# Patient Record
Sex: Female | Born: 1960 | Race: White | Hispanic: No | Smoking: Current every day smoker
Health system: Southern US, Community
[De-identification: ages and names within clinical notes are randomized; demographics above are authoritative.]

## PROBLEM LIST (undated history)

## (undated) DIAGNOSIS — Z9861 Coronary angioplasty status: Secondary | ICD-10-CM

## (undated) DIAGNOSIS — I739 Peripheral vascular disease, unspecified: Secondary | ICD-10-CM

## (undated) DIAGNOSIS — I10 Essential (primary) hypertension: Secondary | ICD-10-CM

## (undated) DIAGNOSIS — H409 Unspecified glaucoma: Secondary | ICD-10-CM

## (undated) DIAGNOSIS — I2510 Atherosclerotic heart disease of native coronary artery without angina pectoris: Secondary | ICD-10-CM

## (undated) DIAGNOSIS — Z8719 Personal history of other diseases of the digestive system: Secondary | ICD-10-CM

## (undated) DIAGNOSIS — F17200 Nicotine dependence, unspecified, uncomplicated: Secondary | ICD-10-CM

## (undated) DIAGNOSIS — K219 Gastro-esophageal reflux disease without esophagitis: Secondary | ICD-10-CM

## (undated) DIAGNOSIS — K449 Diaphragmatic hernia without obstruction or gangrene: Secondary | ICD-10-CM

## (undated) HISTORY — PX: CERVICAL FUSION: SHX112

---

## 2010-11-02 DIAGNOSIS — I2510 Atherosclerotic heart disease of native coronary artery without angina pectoris: Secondary | ICD-10-CM

## 2010-11-02 HISTORY — PX: CORONARY ANGIOPLASTY WITH STENT PLACEMENT: SHX49

## 2010-11-02 HISTORY — DX: Atherosclerotic heart disease of native coronary artery without angina pectoris: I25.10

## 2012-11-02 DIAGNOSIS — I739 Peripheral vascular disease, unspecified: Secondary | ICD-10-CM

## 2012-11-02 HISTORY — DX: Peripheral vascular disease, unspecified: I73.9

## 2012-11-02 HISTORY — PX: FEMORAL BYPASS: SHX50

## 2015-12-04 ENCOUNTER — Encounter (HOSPITAL_COMMUNITY): Payer: Self-pay | Admitting: *Deleted

## 2015-12-04 ENCOUNTER — Other Ambulatory Visit: Payer: Self-pay | Admitting: Cardiology

## 2015-12-04 ENCOUNTER — Emergency Department (HOSPITAL_COMMUNITY)
Admission: EM | Admit: 2015-12-04 | Discharge: 2015-12-04 | Disposition: A | Discharge status: Home / Self Care | Payer: BLUE CROSS/BLUE SHIELD | Attending: Emergency Medicine | Admitting: Emergency Medicine

## 2015-12-04 ENCOUNTER — Emergency Department (HOSPITAL_COMMUNITY): Payer: BLUE CROSS/BLUE SHIELD

## 2015-12-04 ENCOUNTER — Other Ambulatory Visit: Payer: Self-pay

## 2015-12-04 DIAGNOSIS — R1030 Lower abdominal pain, unspecified: Secondary | ICD-10-CM | POA: Diagnosis present

## 2015-12-04 DIAGNOSIS — I10 Essential (primary) hypertension: Secondary | ICD-10-CM | POA: Diagnosis present

## 2015-12-04 DIAGNOSIS — R0602 Shortness of breath: Secondary | ICD-10-CM | POA: Diagnosis not present

## 2015-12-04 DIAGNOSIS — Z9861 Coronary angioplasty status: Principal | ICD-10-CM

## 2015-12-04 DIAGNOSIS — I739 Peripheral vascular disease, unspecified: Secondary | ICD-10-CM | POA: Insufficient documentation

## 2015-12-04 DIAGNOSIS — Z01810 Encounter for preprocedural cardiovascular examination: Secondary | ICD-10-CM

## 2015-12-04 DIAGNOSIS — Z7982 Long term (current) use of aspirin: Secondary | ICD-10-CM | POA: Insufficient documentation

## 2015-12-04 DIAGNOSIS — F17200 Nicotine dependence, unspecified, uncomplicated: Secondary | ICD-10-CM | POA: Diagnosis present

## 2015-12-04 DIAGNOSIS — Z8719 Personal history of other diseases of the digestive system: Secondary | ICD-10-CM | POA: Insufficient documentation

## 2015-12-04 DIAGNOSIS — Z8249 Family history of ischemic heart disease and other diseases of the circulatory system: Secondary | ICD-10-CM

## 2015-12-04 DIAGNOSIS — M79604 Pain in right leg: Secondary | ICD-10-CM | POA: Diagnosis not present

## 2015-12-04 DIAGNOSIS — I2510 Atherosclerotic heart disease of native coronary artery without angina pectoris: Secondary | ICD-10-CM

## 2015-12-04 DIAGNOSIS — F17210 Nicotine dependence, cigarettes, uncomplicated: Secondary | ICD-10-CM | POA: Insufficient documentation

## 2015-12-04 DIAGNOSIS — K219 Gastro-esophageal reflux disease without esophagitis: Secondary | ICD-10-CM | POA: Diagnosis not present

## 2015-12-04 DIAGNOSIS — M79605 Pain in left leg: Secondary | ICD-10-CM | POA: Diagnosis not present

## 2015-12-04 HISTORY — DX: Unspecified glaucoma: H40.9

## 2015-12-04 HISTORY — DX: Coronary angioplasty status: Z98.61

## 2015-12-04 HISTORY — DX: Personal history of other diseases of the digestive system: Z87.19

## 2015-12-04 HISTORY — DX: Nicotine dependence, unspecified, uncomplicated: F17.200

## 2015-12-04 HISTORY — DX: Peripheral vascular disease, unspecified: I73.9

## 2015-12-04 HISTORY — DX: Diaphragmatic hernia without obstruction or gangrene: K44.9

## 2015-12-04 HISTORY — DX: Essential (primary) hypertension: I10

## 2015-12-04 HISTORY — DX: Gastro-esophageal reflux disease without esophagitis: K21.9

## 2015-12-04 HISTORY — DX: Atherosclerotic heart disease of native coronary artery without angina pectoris: I25.10

## 2015-12-04 LAB — COMPREHENSIVE METABOLIC PANEL
ALBUMIN: 4.3 g/dL (ref 3.5–5.0)
ALT: 19 U/L (ref 14–54)
ANION GAP: 11 (ref 5–15)
AST: 18 U/L (ref 15–41)
Alkaline Phosphatase: 84 U/L (ref 38–126)
BUN: 12 mg/dL (ref 6–20)
CHLORIDE: 102 mmol/L (ref 101–111)
CO2: 24 mmol/L (ref 22–32)
Calcium: 10 mg/dL (ref 8.9–10.3)
Creatinine, Ser: 0.94 mg/dL (ref 0.44–1.00)
GFR calc Af Amer: >60 mL/min (ref >60)
Glucose, Bld: 115 mg/dL — ABNORMAL HIGH (ref 65–99)
POTASSIUM: 4.3 mmol/L (ref 3.5–5.1)
Sodium: 137 mmol/L (ref 135–145)
Total Bilirubin: 0.5 mg/dL (ref 0.3–1.2)
Total Protein: 7.1 g/dL (ref 6.5–8.1)

## 2015-12-04 LAB — CBC WITH DIFFERENTIAL/PLATELET
BASOS ABS: 0 10*3/uL (ref 0.0–0.1)
BASOS PCT: 0 %
EOS PCT: 1 %
Eosinophils Absolute: 0.1 10*3/uL (ref 0.0–0.7)
HCT: 46 % (ref 36.0–46.0)
Hemoglobin: 15.7 g/dL — ABNORMAL HIGH (ref 12.0–15.0)
Lymphocytes Relative: 14 %
Lymphs Abs: 1.1 10*3/uL (ref 0.7–4.0)
MCH: 31.8 pg (ref 26.0–34.0)
MCHC: 34.1 g/dL (ref 30.0–36.0)
MCV: 93.3 fL (ref 78.0–100.0)
MONO ABS: 0.6 10*3/uL (ref 0.1–1.0)
Monocytes Relative: 7 %
Neutro Abs: 5.9 10*3/uL (ref 1.7–7.7)
Neutrophils Relative %: 78 %
PLATELETS: 226 10*3/uL (ref 150–400)
RBC: 4.93 MIL/uL (ref 3.87–5.11)
RDW: 13.7 % (ref 11.5–15.5)
WBC: 7.5 10*3/uL (ref 4.0–10.5)

## 2015-12-04 LAB — TROPONIN I

## 2015-12-04 MED ORDER — LISINOPRIL 10 MG PO TABS: 10.0000 mg | ORAL_TABLET | Freq: Every day | ORAL | Status: AC

## 2015-12-04 MED ORDER — PRAVASTATIN SODIUM 40 MG PO TABS: 40.0000 mg | ORAL_TABLET | Freq: Every day | ORAL | Status: DC

## 2015-12-04 MED ORDER — IOHEXOL 350 MG/ML SOLN
100.0000 mL | Freq: Once | INTRAVENOUS | Status: AC | PRN
  Administered 2015-12-04: 100 mL via INTRAVENOUS

## 2015-12-04 MED ORDER — ASPIRIN 81 MG PO CHEW: 81.0000 mg | CHEWABLE_TABLET | Freq: Every day | ORAL | Status: DC

## 2015-12-04 NOTE — Discharge Instructions (Signed)
Peripheral Vascular Disease Peripheral vascular disease (PVD) is a disease of the blood vessels that are not part of your heart and brain. A simple term for PVD is poor circulation. In most cases, PVD narrows the blood vessels that carry blood from your heart to the rest of your body. This can result in a decreased supply of blood to your arms, legs, and internal organs, like your stomach or kidneys. However, it most often affects a person's lower legs and feet. There are two types of PVD.  Organic PVD. This is the more common type. It is caused by damage to the structure of blood vessels.  Functional PVD. This is caused by conditions that make blood vessels contract and tighten (spasm). Without treatment, PVD tends to get worse over time. PVD can also lead to acute ischemic limb. This is when an arm or limb suddenly has trouble getting enough blood. This is a medical emergency. CAUSES Each type of PVD has many different causes. The most common cause of PVD is buildup of a fatty material (plaque) inside of your arteries (atherosclerosis). Small amounts of plaque can break off from the walls of the blood vessels and become lodged in a smaller artery. This blocks blood flow and can cause acute ischemic limb. Other common causes of PVD include:  Blood clots that form inside of blood vessels.  Injuries to blood vessels.  Diseases that cause inflammation of blood vessels or cause blood vessel spasms.  Health behaviors and health history that increase your risk of developing PVD. RISK FACTORS  You may have a greater risk of PVD if you:  Have a family history of PVD.  Have certain medical conditions, including:  High cholesterol.  Diabetes.  High blood pressure (hypertension).  Coronary heart disease.  Past problems with blood clots.  Past injury, such as burns or a broken bone. These may have damaged blood vessels in your limbs.  Buerger disease. This is caused by inflamed blood  vessels in your hands and feet.  Some forms of arthritis.  Rare birth defects that affect the arteries in your legs.  Use tobacco.  Do not get enough exercise.  Are obese.  Are age 50 or older. SIGNS AND SYMPTOMS  PVD may cause many different symptoms. Your symptoms depend on what part of your body is not getting enough blood. Some common signs and symptoms include:  Cramps in your lower legs. This may be a symptom of poor leg circulation (claudication).  Pain and weakness in your legs while you are physically active that goes away when you rest (intermittent claudication).  Leg pain when at rest.  Leg numbness, tingling, or weakness.  Coldness in a leg or foot, especially when compared with the other leg.  Skin or hair changes. These can include:  Hair loss.  Shiny skin.  Pale or bluish skin.  Thick toenails.  Inability to get or maintain an erection (erectile dysfunction). People with PVD are more prone to developing ulcers and sores on their toes, feet, or legs. These may take longer than normal to heal. DIAGNOSIS Your health care provider may diagnose PVD from your signs and symptoms. The health care provider will also do a physical exam. You may have tests to find out what is causing your PVD and determine its severity. Tests may include:  Blood pressure recordings from your arms and legs and measurements of the strength of your pulses (pulse volume recordings).  Imaging studies using sound waves to take pictures of   the blood flow through your blood vessels (Doppler ultrasound).  Injecting a dye into your blood vessels before having imaging studies using:  X-rays (angiogram or arteriogram).  Computer-generated X-rays (CT angiogram).  A powerful electromagnetic field and a computer (magnetic resonance angiogram or MRA). TREATMENT Treatment for PVD depends on the cause of your condition and the severity of your symptoms. It also depends on your age. Underlying  causes need to be treated and controlled. These include long-lasting (chronic) conditions, such as diabetes, high cholesterol, and high blood pressure. You may need to first try making lifestyle changes and taking medicines. Surgery may be needed if these do not work. Lifestyle changes may include:  Quitting smoking.  Exercising regularly.  Following a low-fat, low-cholesterol diet. Medicines may include:  Blood thinners to prevent blood clots.  Medicines to improve blood flow.  Medicines to improve your blood cholesterol levels. Surgical procedures may include:  A procedure that uses an inflated balloon to open a blocked artery and improve blood flow (angioplasty).  A procedure to put in a tube (stent) to keep a blocked artery open (stent implant).  Surgery to reroute blood flow around a blocked artery (peripheral bypass surgery).  Surgery to remove dead tissue from an infected wound on the affected limb.  Amputation. This is surgical removal of the affected limb. This may be necessary in cases of acute ischemic limb that are not improved through medical or surgical treatments. HOME CARE INSTRUCTIONS  Take medicines only as directed by your health care provider.  Do not use any tobacco products, including cigarettes, chewing tobacco, or electronic cigarettes. If you need help quitting, ask your health care provider.  Lose weight if you are overweight, and maintain a healthy weight as directed by your health care provider.  Eat a diet that is low in fat and cholesterol. If you need help, ask your health care provider.  Exercise regularly. Ask your health care provider to suggest some good activities for you.  Use compression stockings or other mechanical devices as directed by your health care provider.  Take good care of your feet.  Wear comfortable shoes that fit well.  Check your feet often for any cuts or sores. SEEK MEDICAL CARE IF:  You have cramps in your legs  while walking.  You have leg pain when you are at rest.  You have coldness in a leg or foot.  Your skin changes.  You have erectile dysfunction.  You have cuts or sores on your feet that are not healing. SEEK IMMEDIATE MEDICAL CARE IF:  Your arm or leg turns cold and blue.  Your arms or legs become red, warm, swollen, painful, or numb.  You have chest pain or trouble breathing.  You suddenly have weakness in your face, arm, or leg.  You become very confused or lose the ability to speak.  You suddenly have a very bad headache or lose your vision.   This information is not intended to replace advice given to you by your health care provider. Make sure you discuss any questions you have with your health care provider.   Document Released: 11/26/2004 Document Revised: 11/09/2014 Document Reviewed: 03/29/2014 Elsevier Interactive Patient Education 2016 [employer]  

## 2015-12-04 NOTE — ED Notes (Signed)
Pt has a history of hypertension. Pt states she has not been able to take her BP meds in 2 years.

## 2015-12-04 NOTE — Consult Note (Addendum)
Vascular Surgery Consultation  Reason for Consult: Pain in both legs-right worse than left with ambulation  HPI: Sara Donovan is a 55 y.o. female who presents for evaluation of pain in both legs with ambulation right worse than left. Patient has history of stent in the left iliac artery most likely and a bypass from the right iliac to the above-knee popliteal artery both performed in [place] 2 years ago. The vascular surgeon who was from [facility] retired and the patient has not been seen in follow-up in the past year or so. She has been having left leg symptoms for the past few months which were not totally disabling. A few days ago she began having severe claudication in the right leg beginning in the buttock extending to the foot after ambulating about 30-40 feet. She does not have rest pain. She has no history of nonhealing ulcers infection or gangrene. She has a heavy history of tobacco abuse 40+ years at 1-2 packs per day. She also has a cardiac history having undergone PTCA with 2 or 3 stents placed about 3 years ago and then all [place]. She has not had a myocardial infarction she states. She denies any active chest pain, dyspnea on exertion, PND, orthopnea, or hemoptysis. She presented to the emergency department because of her worsening symptoms.     Past Medical History  Diagnosis Date  . Hypertension   . PVD (peripheral vascular disease) (HCC)   . Glaucoma   . Hiatal hernia   . GERD (gastroesophageal reflux disease)    Past Surgical History  Procedure Laterality Date  . Femoral bypass    . Cervical fusion     Social History   Social History  . Marital Status: Legally Separated    Spouse Name: N/A  . Number of Children: N/A  . Years of Education: N/A   Social History Main Topics  . Smoking status: Current Every Day Smoker -- 1.00 packs/day    Types: Cigarettes  . Smokeless tobacco: None  . Alcohol Use: No  . Drug Use: Yes    Special: Marijuana   . Sexual Activity: Not Asked   Other Topics Concern  . None   Social History Narrative  . None   No family history on file. Not on File Prior to Admission medications   Medication Sig Start Date End Date Taking? Authorizing Provider  acetaminophen (TYLENOL) 325 MG tablet Take 650 mg by mouth every 6 (six) hours as needed for mild pain.   Yes Historical Provider, MD  Homeopathic Products (LEG CRAMP RELIEF PO) Take 1 tablet by mouth every 8 (eight) hours as needed (leg cramps).   Yes Historical Provider, MD     Positive ROS: See history of present illness. No history of lateralizing weakness, aphasia, amaurosis fugax, diplopia, blurred vision, syncope. Denies active cardiac symptoms at this time. No diabetes mellitus.  All other systems have been reviewed and were otherwise negative with the exception of those mentioned in the HPI and as above.  Physical Exam: Filed Vitals:   12/04/15 0900 12/04/15 0930  BP: 160/79 177/75  Pulse: 54 62  Temp:    Resp: 13 17    General: Alert, no acute distress HEENT: Normal for age Cardiovascular: Regular rate and rhythm. Carotid pulses 2+, no bruits audible Respiratory: Clear to auscultation. No cyanosis, no use of accessory musculature GI: No organomegaly, abdomen is soft and non-tender Skin: No lesions in the area of chief complaint Neurologic: Sensation intact distally Psychiatric: Patient  is competent for consent with normal mood and affect Musculoskeletal: No obvious deformities Extremities: Absent femoral pulses to palpation bilaterally and no distal pulses palpable. Both feet have intact motion and sensation. Audible left posterior tibial and dorsalis pedis though not palpable Audible right AT at  ankle which is sluggish. No calf tenderness or decreased motion or sensation bilaterally.      Assessment/Plan:  #1 severe PAD with occlusion of right iliopopliteal [place] graft placed 2 years ago and antral [place] #2 probable  failing stent in left iliac system performed at about the same time 2 years ago #3 coronary artery disease-status post PTCA with 2 or 3 stents placed in [place] about 3 years ago #4 severe ongoing tobacco abuse-40+ years at 1-2 packs per day  #1 we'll obtain CT angiogram with runoff. Suspect patient has severe aortoiliac occlusive disease and occlusion of right superficial femoral artery and will likely need aortobifemoral bypass grafting #2 cardiology consult for cardiac clearance for above surgery. Have contacted cardiology today and they will see patient in emergency department  #3 strongly emphasized to patient the importance of tobacco cessation because of her multiple vascular problems Will review CT angiogram later today and make formal recommendations   Josephina Gip, MD 12/04/2015 10:42 AM

## 2015-12-04 NOTE — Progress Notes (Signed)
Patient ID: Sara Donovan, female   DOB: [birth year], 55 y.o.   MRN: [id number] Patient's CT angiogram of abdomen and pelvis and lower extremities reviewed Severe diffuse bilateral iliac occlusive disease with total occlusion right iliac system and near total occlusion left iliac system. Left SFA is patent right SFA is occluded with reconstitution of below-knee popliteal artery. Profunda and right inguinal area is small but does visualize.  Patient needs aortobifemoral bypass grafting with possible right femoral popliteal bypass grafting. Await cardiac clearance Will schedule her tentatively for Wednesday, January 15 unless symptoms worsen in the interim She is having no rest pain at the present time but for symptoms worsen she will be in touch with [place] Okay to DC home after cardiology sees patient today  I've discussed this with patient and the risks involved and she would like to proceed on Wednesday the 15th as indicated

## 2015-12-04 NOTE — ED Provider Notes (Signed)
CSN: [id number]     Arrival date & time 12/04/15  0725 History   First MD Initiated Contact with Patient 12/04/15 [phone]     Chief Complaint  Patient presents with  . Leg Pain  . Groin Pain     Patient is a 55 y.o. female presenting with leg pain and groin pain. The history is provided by the patient.  Leg Pain Associated symptoms: no fatigue, no fever and no neck pain   Groin Pain Associated symptoms include shortness of breath. Pertinent negatives include no chest pain and no abdominal pain.   patient resents with pain in her right groin and going down the leg. She's had a previous vascular bypass up at [facility]. She also had stents in the other leg. States that she has had increasing pain with walking. Is in the whole leg. Some shortness of breath with the exertion tube with no chest pain. No pain at rest. States her right foot is cooler now. No trauma.   she has been off her medications for around 2 years. She states she was on meds for blood pressure high cholesterol but then could not afford them. She states she also had been on Plavix but had bleeding somewhere and she got 12 units of blood. States were not able to find area may stop the medicine. States she tried to get into a vascular surgeon here but was not able to without referral. No fevers or chills. She will get occasional headaches. Denies substance abuse. She continues to smoke. She states they tried to do the bypass at one area on her leg pointing to proximal thigh but states had to move higher for it. There is a scar on her abdomen. There is also a scar near the Past Medical History  Diagnosis Date  . Hypertension   . PVD (peripheral vascular disease) (HCC) 2014    Rt BPG, Lt PTA  . Glaucoma   . Hiatal hernia   . GERD (gastroesophageal reflux disease)   . H/O: GI bleed     while on Plavix, required transfusion  . CAD S/P percutaneous coronary angioplasty 2012    "3 stents" at [facility]  . Smoker    Past Surgical  History  Procedure Laterality Date  . Femoral bypass  2014  . Cervical fusion    . Coronary angioplasty with stent placement  2012    [facility]   No family history on file. Social History  Substance Use Topics  . Smoking status: Current Every Day Smoker -- 1.00 packs/day    Types: Cigarettes  . Smokeless tobacco: None  . Alcohol Use: No   OB History    No data available     Review of Systems  Constitutional: Negative for fever, appetite change and fatigue.  Respiratory: Positive for shortness of breath.   Cardiovascular: Negative for chest pain.  Gastrointestinal: Negative for abdominal pain.  Musculoskeletal: Positive for gait problem. Negative for joint swelling and neck pain.  Skin: Negative for color change.  Neurological: Negative for numbness.  Hematological: Negative for adenopathy.      Allergies  Review of patient's allergies indicates no known allergies.  Home Medications   Prior to Admission medications   Medication Sig Start Date End Date Taking? Authorizing Provider  acetaminophen (TYLENOL) 325 MG tablet Take 650 mg by mouth every 6 (six) hours as needed for mild pain.   Yes Historical Provider, MD  aspirin 81 MG chewable tablet Chew 1 tablet (81 mg total) by  mouth daily. 12/04/15   Benjiman Core, MD  Homeopathic Products (LEG CRAMP RELIEF PO) Take 1 tablet by mouth every 8 (eight) hours as needed (leg cramps).   Yes Historical Provider, MD  lisinopril (PRINIVIL,ZESTRIL) 10 MG tablet Take 1 tablet (10 mg total) by mouth daily. 12/04/15   Benjiman Core, MD  pravastatin (PRAVACHOL) 40 MG tablet Take 1 tablet (40 mg total) by mouth daily. 12/04/15   Benjiman Core, MD   BP 189/80 mmHg  Pulse 56  Temp(Src) 98.3 F (36.8 C) (Oral)  Resp 13  SpO2 97% Physical Exam  Constitutional: She appears well-developed.  HENT:  Head: Atraumatic.  Eyes: EOM are normal.  Cardiovascular: Normal rate.   Pulmonary/Chest: She is in respiratory distress.  Abdominal: Soft.  There is no tenderness.  Genitourinary:  Tenderness to right groin medially palpable femoral pulse.  Musculoskeletal:  Right foot cooler than contralateral. Mildly delays capillary refill. Dopplerable dorsalis pedis pulse but not palpable. Unable to palpate a popliteal pulse. No tenderness to the calf or foot. No wounds to the foot.  Skin: Skin is warm.    ED Course  Procedures (including critical care time) Labs Review Labs Reviewed  CBC WITH DIFFERENTIAL/PLATELET - Abnormal; Notable for the following:    Hemoglobin 15.7 (*)    All other components within normal limits  COMPREHENSIVE METABOLIC PANEL - Abnormal; Notable for the following:    Glucose, Bld 115 (*)    All other components within normal limits  TROPONIN I    Imaging Review Ct Angio Ao+bifem W/cm &/or Wo/cm  12/04/2015  CLINICAL DATA:  Bilateral claudication. EXAM: CT ANGIOGRAPHY OF ABDOMINAL AORTA WITH ILIOFEMORAL RUNOFF TECHNIQUE: Multidetector CT imaging of the abdomen, pelvis and lower extremities was performed using the standard protocol during bolus administration of intravenous contrast. Multiplanar CT image reconstructions and MIPs were obtained to evaluate the vascular anatomy. CONTRAST:  OMNIPAQUE IOHEXOL 350 MG/ML SOLN COMPARISON:  None. FINDINGS: Aorta: Visualized lung bases are unremarkable. No gallstones are noted. The liver, spleen and pancreas are unremarkable. Kidneys and right adrenal gland appear normal. 2.6 cm low density left adrenal mass is noted with average Hounsfield measurement of 7, most consistent with adenoma. The appendix appears normal. There is no evidence of bowel obstruction. 3.5 cm left ovarian cyst is noted. Atherosclerosis of abdominal aorta is noted without aneurysm or dissection. The renal and mesenteric arteries are widely patent without significant stenosis. Right Lower Extremity: There is complete occlusion of the right common and external iliac and common femoral arteries. Occluded  bypass graft is seen extending from right common iliac artery to common femoral artery. Collateral vessels are seen leading to reconstitution of right femoral popliteal bypass graft, which is patent. There is noted moderate diffuse narrowing of the distal portion of the graft secondary to atheromatous disease. Right popliteal artery is patent although small in caliber, with all 3 trifurcation vessels patent and flowing toward ankle mortise. Left Lower Extremity: Severe atheromatous disease is seen involving the left common and external iliac arteries with multifocal severe stenoses present. There is complete occlusion of the left common femoral artery. Collaterals are seen leading to reconstitution of the left superficial femoral artery, which appears to have stents throughout most of its course. Severe focal stenosis is noted in its most distal portion, which is not stented. Left popliteal artery is patent although diminutive in caliber. All 3 trifurcation vessels are patent proximally and seen flowing toward the ankle mortise. Review of the MIP images confirms the above findings.  IMPRESSION: Atherosclerosis of abdominal aorta is noted without aneurysm or dissection. Complete occlusion of the right common and external iliac and common femoral arteries is noted. Popliteal graft extending from right common iliac artery to right common femoral artery is occluded. Collateral vessels are leading to reconstitution of right femoral popliteal bypass graft which is patent. There is noted moderate diffuse narrowing in the distal portion of the graft. Right popliteal artery is widely patent with 3 vessel runoff seen in the right calf. Severe atheromatous disease is noted involving the left common and external iliac arteries with multifocal severe stenoses present. 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 Collaterals lead to reconstitution of the left superficial femoral artery, which appears to have stents  throughout its proximal and middle portions which are widely patent. Severe focal stenosis is noted in the most distal non stented portion of the left superficial from artery. Left popliteal artery is patent although diminutive in caliber, with 3 vessel runoff present in the left calf. 2.6 cm low density left adrenal mass is noted most consistent with adenoma, but followup CT or MRI in 12 months is recommended to ensure stability. 3.5 cm left ovarian cyst is noted. If the patient is postmenopausal, follow-up pelvic ultrasound in 1 year is recommended to ensure stability and rule out neoplasm. Electronically Signed   By: Lupita Raider, M.D.   On: 12/04/2015 13:17   I have personally reviewed and evaluated these images and lab results as part of my medical decision-making.   EKG Interpretation   Date/Time:  Wednesday December 04 2015 08:46:29 EST Ventricular Rate:  58 PR Interval:  139 QRS Duration: 96 QT Interval:  427 QTC Calculation: 419 R Axis:   75 Text Interpretation:  Sinus rhythm Atrial premature complex Biatrial  enlargement Borderline repolarization abnormality Confirmed by Rubin Payor   MD, Shavon Zenz [phone]) on 12/04/2015 8:58:29 AM      MDM   Final diagnoses:  Claudication (HCC)  PVD (peripheral vascular disease) (HCC)      patient with claudication symptoms of right leg. Does have a dopplerable pulse but not palpable in the right foot. Some delayed capillary refill. Has had likely a iliac or aortic popliteal bypass. Blood pressure on the arm was 188/100 and blood pressure on the right ankle was 144/110.   Patient's been seen by Dr. Hart Rochester from vascular surgery and by cardiology. Outpatient follow-up is been arranged and she'll likely need surgery. Discharge home.   Benjiman Core, MD 12/04/15 [phone]

## 2015-12-04 NOTE — ED Notes (Addendum)
Pt in from home c/o R leg numbness onset x 2 days with groin pain, pt states, "It hurts where they went in & went to do my bypass in my right leg. It feels like there is knots in there." pt ambulatory with pain, pt denies slurred speech or dysarthria, no facial droop, pt also reports L leg pain onset x3 mths, pt reports non compliance with HTN & cholesterol meds d/t finances,pt A&O x4, follows commands, speaks in complete sentences

## 2015-12-04 NOTE — Consult Note (Addendum)
Reason for Consult:   Pre op clearance  Requesting Physician: Dr Hart Rochester Primary Cardiologist New (was Dr Earna Coder in [place])  HPI:   55 y/o female from [place] with a history of CAD and PVD. Pt says she had a cath for chest pain in 2012 that showed PVD and CAD. She was sent to [facility] and had "3 stents" placed. In 2014 she was unable to do a treadmill and subsequent workup revealed severe LE PVD. She had Lt LE PTA and RLE BPG. She stopped all her medications > 1 yr ago-"my husband left me, no insurance". She now works at [employer]. She has had increasing RLE claudication. It got to the point where she had pain going from room to room. She could not get an apt with a vascular surgeon so she came to the ED. CTA shows severe bilateral PVD. We are asked to see her for pre op clearance. She denies any chest pain since her coronary stents were placed in [address].   PMHx:  Past Medical History  Diagnosis Date  . Hypertension   . PVD (peripheral vascular disease) (HCC)   . Glaucoma   . Hiatal hernia   . GERD (gastroesophageal reflux disease)     Past Surgical History  Procedure Laterality Date  . Femoral bypass    . Cervical fusion      SOCHx:  reports that she has been smoking Cigarettes.  She has been smoking about 1.00 pack per day. She does not have any smokeless tobacco history on file. She reports that she uses illicit drugs (Marijuana). She reports that she does not drink alcohol.  FAMHx: No family history on file.  ALLERGIES: No Known Allergies  ROS: Review of Systems: General: negative for chills, fever, night sweats or weight changes.  Cardiovascular: negative for chest pain, dyspnea on exertion, edema, orthopnea, palpitations, paroxysmal nocturnal dyspnea or shortness of breath HEENT: negative for any visual disturbances, blindness, glaucoma Dermatological: negative for rash Respiratory: negative for cough, hemoptysis, or wheezing Urologic: negative  for hematuria or dysuria Abdominal: negative for nausea, vomiting, diarrhea, bright red blood per rectum, melena, or hematemesis Neurologic: negative for visual changes, syncope, or dizziness Musculoskeletal: negative for back pain, joint pain, or swelling Psych: cooperative and appropriate All other systems reviewed and are otherwise negative except as noted above.   HOME MEDICATIONS: Prior to Admission medications   Medication Sig Start Date End Date Taking? Authorizing Provider  acetaminophen (TYLENOL) 325 MG tablet Take 650 mg by mouth every 6 (six) hours as needed for mild pain.   Yes Historical Provider, MD  Homeopathic Products (LEG CRAMP RELIEF PO) Take 1 tablet by mouth every 8 (eight) hours as needed (leg cramps).   Yes Historical Provider, MD    HOSPITAL MEDICATIONS: I have reviewed the patient's current medications.  VITALS: Blood pressure 168/78, pulse 56, temperature 98 F (36.7 C), temperature source Oral, resp. rate 16, SpO2 98 %.  PHYSICAL EXAM: General appearance: alert, cooperative, appears older than stated age and no distress Neck: no carotid bruit and no JVD Lungs: decreased breath sounds Heart: regular rate and rhythm Abdomen: soft, non-tender; bowel sounds normal; no masses,  no organomegaly Extremities: no edema Pulses: diminnished distal pulses Skin: pale cool dry Neurologic: Grossly normal  LABS: Results for orders placed or performed during the hospital encounter of 12/04/15 (from the past 24 hour(s))  Troponin I     Status: None   Collection Time: 12/04/15  8:24 AM  Result Value Ref Range   Troponin I <0.03 <0.031 ng/mL  CBC with Differential     Status: Abnormal   Collection Time: 12/04/15  8:24 AM  Result Value Ref Range   WBC 7.5 4.0 - 10.5 K/uL   RBC 4.93 3.87 - 5.11 MIL/uL   Hemoglobin 15.7 (H) 12.0 - 15.0 g/dL   HCT 45.4 09.8 - 11.9 %   MCV 93.3 78.0 - 100.0 fL   MCH 31.8 26.0 - 34.0 pg   MCHC 34.1 30.0 - 36.0 g/dL   RDW 14.7 82.9 -  56.2 %   Platelets 226 150 - 400 K/uL   Neutrophils Relative % 78 %   Neutro Abs 5.9 1.7 - 7.7 K/uL   Lymphocytes Relative 14 %   Lymphs Abs 1.1 0.7 - 4.0 K/uL   Monocytes Relative 7 %   Monocytes Absolute 0.6 0.1 - 1.0 K/uL   Eosinophils Relative 1 %   Eosinophils Absolute 0.1 0.0 - 0.7 K/uL   Basophils Relative 0 %   Basophils Absolute 0.0 0.0 - 0.1 K/uL  Comprehensive metabolic panel     Status: Abnormal   Collection Time: 12/04/15  8:24 AM  Result Value Ref Range   Sodium 137 135 - 145 mmol/L   Potassium 4.3 3.5 - 5.1 mmol/L   Chloride 102 101 - 111 mmol/L   CO2 24 22 - 32 mmol/L   Glucose, Bld 115 (H) 65 - 99 mg/dL   BUN 12 6 - 20 mg/dL   Creatinine, Ser 1.30 0.44 - 1.00 mg/dL   Calcium 86.5 8.9 - 78.4 mg/dL   Total Protein 7.1 6.5 - 8.1 g/dL   Albumin 4.3 3.5 - 5.0 g/dL   AST 18 15 - 41 U/L   ALT 19 14 - 54 U/L   Alkaline Phosphatase 84 38 - 126 U/L   Total Bilirubin 0.5 0.3 - 1.2 mg/dL   GFR calc non Af Amer >60 >60 mL/min   GFR calc Af Amer >60 >60 mL/min   Anion gap 11 5 - 15    EKG: NSR NSST changes  IMAGING: Ct Angio Ao+bifem W/cm &/or Wo/cm  12/04/2015  CLINICAL DATA:  Bilateral claudication. EXAM: CT ANGIOGRAPHY OF ABDOMINAL AORTA WITH ILIOFEMORAL RUNOFF TECHNIQUE: Multidetector CT imaging of the abdomen, pelvis and lower extremities was performed using the standard protocol during bolus administration of intravenous contrast. Multiplanar CT image reconstructions and MIPs were obtained to evaluate the vascular anatomy. CONTRAST:  OMNIPAQUE IOHEXOL 350 MG/ML SOLN COMPARISON:  None. FINDINGS: Aorta: Visualized lung bases are unremarkable. No gallstones are noted. The liver, spleen and pancreas are unremarkable. Kidneys and right adrenal gland appear normal. 2.6 cm low density left adrenal mass is noted with average Hounsfield measurement of 7, most consistent with adenoma. The appendix appears normal. There is no evidence of bowel obstruction. 3.5 cm left ovarian  cyst is noted. Atherosclerosis of abdominal aorta is noted without aneurysm or dissection. The renal and mesenteric arteries are widely patent without significant stenosis. Right Lower Extremity: There is complete occlusion of the right common and external iliac and common femoral arteries. Occluded bypass graft is seen extending from right common iliac artery to common femoral artery. Collateral vessels are seen leading to reconstitution of right femoral popliteal bypass graft, which is patent. There is noted moderate diffuse narrowing of the distal portion of the graft secondary to atheromatous disease. Right popliteal artery is patent although small in caliber, with all 3 trifurcation vessels patent and  flowing toward ankle mortise. Left Lower Extremity: Severe atheromatous disease is seen involving the left common and external iliac arteries with multifocal severe stenoses present. There is complete occlusion of the left common femoral artery. Collaterals are seen leading to reconstitution of the left superficial femoral artery, which appears to have stents throughout most of its course. Severe focal stenosis is noted in its most distal portion, which is not stented. Left popliteal artery is patent although diminutive in caliber. All 3 trifurcation vessels are patent proximally and seen flowing toward the ankle mortise. Review of the MIP images confirms the above findings. IMPRESSION: Atherosclerosis of abdominal aorta is noted without aneurysm or dissection. Complete occlusion of the right common and external iliac and common femoral arteries is noted. Popliteal graft extending from right common iliac artery to right common femoral artery is occluded. Collateral vessels are leading to reconstitution of right femoral popliteal bypass graft which is patent. There is noted moderate diffuse narrowing in the distal portion of the graft. Right popliteal artery is widely patent with 3 vessel runoff seen in the right  calf. Severe atheromatous disease is noted involving the left common and external iliac arteries with multifocal severe stenoses present. 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 Collaterals lead to reconstitution of the left superficial femoral artery, which appears to have stents throughout its proximal and middle portions which are widely patent. Severe focal stenosis is noted in the most distal non stented portion of the left superficial from artery. Left popliteal artery is patent although diminutive in caliber, with 3 vessel runoff present in the left calf. 2.6 cm low density left adrenal mass is noted most consistent with adenoma, but followup CT or MRI in 12 months is recommended to ensure stability. 3.5 cm left ovarian cyst is noted. If the patient is postmenopausal, follow-up pelvic ultrasound in 1 year is recommended to ensure stability and rule out neoplasm. Electronically Signed   By: Lupita Raider, M.D.   On: 12/04/2015 13:17    IMPRESSION: Active Problems:   Pre-operative cardiovascular examination, high risk surgery   CAD S/P PCI 2012   PVD (peripheral vascular disease) (HCC)   Smoker   Essential hypertension   Family history of coronary artery disease   RECOMMENDATION: OP Myoview, resume meds, f/u after Myoview.   Time Spent Directly with Patient: 45 minutes  Corine Shelter, [place]  [phone] beeper 12/04/2015, 1:32 PM   I have personally seen and examined this patient with Corine Shelter, PA-C. I agree with the assessment and plan as outlined above. She is presenting with vascular issues. She has been seen by Dr. Hart Rochester. 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 She is being advised by Vascular Surgery that she can go home from their standpoint. She has a history of CAD with coronary stents placed in [place] in 2012 but no f/u since then. She has not been taking her cardiac meds. No chest  pain or SOB. Cardiac exam is normal. No reason to admit. Will get her back on ASA, statin, beta blocker and Ace-inh. Will arrange outpatient nuclear stress test in next few days and then f/u in our office. Pt can go home. ED staff to address pain and work excuse.   MCALHANY,CHRISTOPHER 12/04/2015 1:53 PM  Addendum to PMH: Pt related a history of a GI bleed requiring transfusion while on Plavix in the past.  Corine Shelter PA-C 12/04/2015 2:00 PM

## 2015-12-05 ENCOUNTER — Telehealth: Payer: Self-pay | Admitting: Cardiology

## 2015-12-05 NOTE — Telephone Encounter (Signed)
New Message  Pt calling she states that she was told by Diona Fanti that she was suppose  To come in office for a procedure today 12/05/15

## 2015-12-05 NOTE — Telephone Encounter (Signed)
Pt aware we will call to schedule stress test - fwd to scheduling pool.

## 2015-12-06 ENCOUNTER — Telehealth (HOSPITAL_COMMUNITY): Payer: Self-pay | Admitting: *Deleted

## 2015-12-06 ENCOUNTER — Telehealth: Payer: Self-pay

## 2015-12-06 NOTE — Telephone Encounter (Signed)
Left message for patient to call back regarding scheduling surgery with Dr. Hart Rochester.

## 2015-12-09 ENCOUNTER — Telehealth: Payer: Self-pay | Admitting: Cardiology

## 2015-12-09 NOTE — Telephone Encounter (Signed)
i  Have called this patient 3 timesd and left messages for her to call to schedule preop stress test. February 2, 3 and 6.

## 2015-12-13 ENCOUNTER — Encounter (HOSPITAL_COMMUNITY): Payer: BLUE CROSS/BLUE SHIELD

## 2015-12-13 ENCOUNTER — Telehealth: Payer: Self-pay | Admitting: *Deleted

## 2015-12-13 NOTE — Telephone Encounter (Signed)
LMTCB x 2 regarding cancellation of surgery for 12-18-15. There are no other phone numbers in EPIC.

## 2015-12-18 ENCOUNTER — Inpatient Hospital Stay (HOSPITAL_COMMUNITY)
Admission: RE | Admit: 2015-12-18 | Payer: BLUE CROSS/BLUE SHIELD | Source: Ambulatory Visit | Admitting: Vascular Surgery

## 2015-12-18 ENCOUNTER — Encounter (HOSPITAL_COMMUNITY): Admission: RE | Payer: Self-pay | Source: Ambulatory Visit

## 2015-12-18 SURGERY — CREATION, BYPASS, ARTERIAL, AORTA TO FEMORAL, BILATERAL, USING GRAFT
Anesthesia: General | Laterality: Right

## 2016-05-07 ENCOUNTER — Ambulatory Visit: Payer: BLUE CROSS/BLUE SHIELD | Admitting: Physical Therapy

## 2016-05-11 ENCOUNTER — Encounter: Payer: Self-pay | Admitting: Physical Therapy

## 2016-05-11 ENCOUNTER — Ambulatory Visit: Payer: BLUE CROSS/BLUE SHIELD | Attending: Nurse Practitioner | Admitting: Physical Therapy

## 2016-05-11 DIAGNOSIS — M62830 Muscle spasm of back: Secondary | ICD-10-CM | POA: Insufficient documentation

## 2016-05-11 DIAGNOSIS — M5442 Lumbago with sciatica, left side: Secondary | ICD-10-CM | POA: Diagnosis present

## 2016-05-11 DIAGNOSIS — M6281 Muscle weakness (generalized): Secondary | ICD-10-CM | POA: Insufficient documentation
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
## 2016-05-14 ENCOUNTER — Ambulatory Visit: Payer: BLUE CROSS/BLUE SHIELD | Admitting: Physical Therapy

## 2016-05-14 DIAGNOSIS — M5442 Lumbago with sciatica, left side: Secondary | ICD-10-CM | POA: Diagnosis not present

## 2016-05-14 DIAGNOSIS — M6281 Muscle weakness (generalized): Secondary | ICD-10-CM

## 2016-05-14 DIAGNOSIS — M62830 Muscle spasm of back: Secondary | ICD-10-CM
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
## 2016-05-20 ENCOUNTER — Ambulatory Visit: Payer: BLUE CROSS/BLUE SHIELD | Admitting: Physical Therapy

## 2016-05-20 DIAGNOSIS — M6281 Muscle weakness (generalized): Secondary | ICD-10-CM

## 2016-05-20 DIAGNOSIS — M5442 Lumbago with sciatica, left side: Secondary | ICD-10-CM

## 2016-05-20 DIAGNOSIS — M62830 Muscle spasm of back: Secondary | ICD-10-CM
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
## 2016-05-22 ENCOUNTER — Ambulatory Visit: Payer: BLUE CROSS/BLUE SHIELD | Admitting: Physical Therapy

## 2016-05-22 DIAGNOSIS — M62830 Muscle spasm of back: Secondary | ICD-10-CM

## 2016-05-22 DIAGNOSIS — M5442 Lumbago with sciatica, left side: Secondary | ICD-10-CM

## 2016-05-22 DIAGNOSIS — M6281 Muscle weakness (generalized): Secondary | ICD-10-CM
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
## 2016-05-27 ENCOUNTER — Ambulatory Visit: Payer: BLUE CROSS/BLUE SHIELD | Admitting: Physical Therapy

## 2016-05-27 DIAGNOSIS — M6281 Muscle weakness (generalized): Secondary | ICD-10-CM

## 2016-05-27 DIAGNOSIS — M5442 Lumbago with sciatica, left side: Secondary | ICD-10-CM

## 2016-05-27 DIAGNOSIS — M62830 Muscle spasm of back: Secondary | ICD-10-CM
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
## 2016-05-29 ENCOUNTER — Ambulatory Visit: Payer: BLUE CROSS/BLUE SHIELD | Admitting: Physical Therapy

## 2016-06-01 ENCOUNTER — Ambulatory Visit: Payer: BLUE CROSS/BLUE SHIELD | Admitting: Physical Therapy

## 2016-06-01 DIAGNOSIS — M5442 Lumbago with sciatica, left side: Secondary | ICD-10-CM | POA: Diagnosis not present

## 2016-06-01 DIAGNOSIS — M62830 Muscle spasm of back: Secondary | ICD-10-CM

## 2016-06-01 DIAGNOSIS — M6281 Muscle weakness (generalized): Secondary | ICD-10-CM

## 2016-06-01 NOTE — Therapy (Addendum)
[facility] Outpatient Rehabilitation [facility] [address] [place], [place], [address] Phone: [phone]   Fax:  [phone]  Physical Therapy Treatment  Patient Details  Name: Sara Donovan MRN: [id number] Date of Birth: 06-21-1961 Referring Provider: Celine Mans NP  Encounter Date: 06/01/2016      PT End of Session - 06/01/16 1104    Visit Number 7   Number of Visits 16   Date for PT Re-Evaluation 07/06/16   Authorization Type BCBS   PT Start Time 1102   PT Stop Time 1202   PT Time Calculation (min) 60 min      Past Medical History:  Diagnosis Date  . CAD S/P percutaneous coronary angioplasty 2012   "3 stents" at [facility]  . GERD (gastroesophageal reflux disease)   . Glaucoma   . H/O: GI bleed    while on Plavix, required transfusion  . Hiatal hernia   . Hypertension   . PVD (peripheral vascular disease) (HCC) 2014   Rt BPG, Lt PTA  . Smoker     Past Surgical History:  Procedure Laterality Date  . CERVICAL FUSION    . CORONARY ANGIOPLASTY WITH STENT PLACEMENT  2012   [facility]  . FEMORAL BYPASS  2014    There were no vitals filed for this visit.      Subjective Assessment - 06/01/16 1104    Currently in Pain? Yes   Pain Score 7    Pain Location Back   Pain Descriptors / Indicators Aching                         [facility] Adult PT Treatment/Exercise - 06/01/16 0001      Lumbar Exercises: Stretches   Single Knee to Chest Stretch Limitations 2x20sec    Prone on Elbows Stretch Limitations prone lying   decreased buttock pain, increased LBP     Lumbar Exercises: Supine   Clam Limitations red 2x10 with ab set   Straight Leg Raise 10 reps  both     Lumbar Exercises: Sidelying   Clam 20 reps   Clam Limitations also reverse clam x 20 each     Lumbar Exercises: Prone   Other Prone Lumbar Exercises Tra contraction x 10 , hamstring curls x 10 each with Tra contract ,  hip extensions alternating x 10     Modalities   Modalities Electrical Stimulation     Electrical Stimulation   Electrical Stimulation Location Bilateral low back   Electrical Stimulation Action IFC   Electrical Stimulation Goals Pain                  PT Short Term Goals - 05/27/16 1039      PT SHORT TERM GOAL #1   Title Patient will demonstrate a fair core contraction    Time 4   Period Weeks   Status On-going     PT SHORT TERM GOAL #2   Title Patient will increase gross bilateral lower extremity strength to 4+/5   Time 4   Period Weeks   Status On-going     PT SHORT TERM GOAL #3   Title Patient will report centralized radicular pain into the lower back    Time 4   Period Weeks   Status On-going     PT SHORT TERM GOAL #4   Title Patient will be Independenyt with HEP for core strength and stretching    Time 4   Period Weeks  Status On-going           PT Long Term Goals - 05/11/16 1351      PT LONG TERM GOAL #1   Title Patient will ambulate 2000' without increased lower back pain in order to go shopping    Time 8   Period Weeks   Status New     PT LONG TERM GOAL #2   Title Patient will increase lumbar flexion by 25% without increased pain in order to pick things up off the ground.    Time 8   Period Weeks   Status New     PT LONG TERM GOAL #3   Title Patient will sit for 1 hour without increased pain in order to ride in a car to go places    Time 8   Period Weeks   Status New     PT LONG TERM GOAL #4   Title Patient will demsotrate a 48% limitation on FOTO    Time 8   Period Weeks   Status New               Plan - 06/01/16 1124    Clinical Impression Statement Pt reports sick with a virus over the weekend. She has increased LBP today at 7/10 located both sides of low back radiating into bilateral glutes. Prone lying centralized LBP. Worked on Tra recruitment in prone and added leg motions. Increased LBP with hip extensions. Gentle hip strengthening in side lying tolerated well.  TENS order faxed to primary MD.    PT Next Visit Plan Begin light stregthening; continue with manual therapy. continue modalities, continue to progress stregthening. Check for TENS order;   PT Home Exercise Plan hamstring 90/90 stretch, continue single leg to chest stretch; hip ER stretch, piriformis stretch, lateral trunk rotation.      Patient will benefit from skilled therapeutic intervention in order to improve the following deficits and impairments:  Abnormal gait, Decreased activity tolerance, Decreased strength, Decreased mobility, Improper body mechanics, Pain, Increased muscle spasms, Decreased endurance, Decreased range of motion  Visit Diagnosis: Left-sided low back pain with left-sided sciatica  Muscle spasm of back  Muscle weakness (generalized)     Problem List Patient Active Problem List   Diagnosis Date Noted  . Pre-operative cardiovascular examination, high risk surgery 12/04/2015  . CAD S/P PCI 2012 12/04/2015  . PVD (peripheral vascular disease) (HCC) 12/04/2015  . Smoker 12/04/2015  . Family history of coronary artery disease 12/04/2015  . Essential hypertension 12/04/2015  . H/O: GI bleed     Sherrie Mustache, [place] 06/01/2016, 12:04 PM  [facility] [address] [place], [place], [address] Phone: [phone]   Fax:  [phone]  Name: Sara Donovan MRN: [id number] Date of Birth: Jan 10, 1961

## 2016-06-03 ENCOUNTER — Ambulatory Visit: Payer: BLUE CROSS/BLUE SHIELD | Attending: Nurse Practitioner | Admitting: Physical Therapy

## 2016-06-03 DIAGNOSIS — M62830 Muscle spasm of back: Secondary | ICD-10-CM | POA: Diagnosis not present

## 2016-06-03 DIAGNOSIS — M6281 Muscle weakness (generalized): Secondary | ICD-10-CM | POA: Insufficient documentation

## 2016-06-03 DIAGNOSIS — M5442 Lumbago with sciatica, left side: Secondary | ICD-10-CM | POA: Insufficient documentation

## 2016-06-03 NOTE — Therapy (Signed)
[facility] Outpatient Rehabilitation [facility] [address] [place], [place], [address] Phone: [phone]   Fax:  [phone]  Physical Therapy Treatment  Patient Details  Name: Sara Donovan MRN: [id number] Date of Birth: Jul 06, 1961 Referring Provider: Celine Mans NP  Encounter Date: 06/03/2016      PT End of Session - 06/03/16 1103    Visit Number 8   Number of Visits 16   Date for PT Re-Evaluation 07/06/16   Authorization Type BCBS   PT Start Time 1056   PT Stop Time 1205   PT Time Calculation (min) 69 min      Past Medical History:  Diagnosis Date  . CAD S/P percutaneous coronary angioplasty 2012   "3 stents" at [facility]  . GERD (gastroesophageal reflux disease)   . Glaucoma   . H/O: GI bleed    while on Plavix, required transfusion  . Hiatal hernia   . Hypertension   . PVD (peripheral vascular disease) (HCC) 2014   Rt BPG, Lt PTA  . Smoker     Past Surgical History:  Procedure Laterality Date  . CERVICAL FUSION    . CORONARY ANGIOPLASTY WITH STENT PLACEMENT  2012   [facility]  . FEMORAL BYPASS  2014    There were no vitals filed for this visit.      Subjective Assessment - 06/03/16 1108    Currently in Pain? Yes   Pain Score 7    Pain Location Back   Pain Orientation Right;Left   Pain Descriptors / Indicators Aching   Aggravating Factors  bending, standing, walking, twisting                         [facility] Adult PT Treatment/Exercise - 06/03/16 0001      Self-Care   Self-Care Other Self-Care Comments   Other Self-Care Comments  Issued TENS and reviewed precautions and contraindications     Lumbar Exercises: Stretches   Passive Hamstring Stretch Limitations 2x20sec    Single Knee to Chest Stretch Limitations 2x20sec    Piriformis Stretch Limitations 2x20sec      Lumbar Exercises: Supine   Other Supine Lumbar Exercises Tra contract with ball adduction 10 x 2, then siometric abduction with Tra contract 10 x 2 for  5 seconds      Lumbar Exercises: Sidelying   Clam 20 reps  red band   Clam Limitations Reverse clam 2# 10 x 2     Modalities   Modalities Electrical Stimulation     Electrical Stimulation   Electrical Stimulation Location Bilateral low back   Electrical Stimulation Action TENS Burst 1 x 15 mintes   Electrical Stimulation Parameters 9 ma   Electrical Stimulation Goals Pain                PT Education - 06/03/16 1219    Education provided Yes   Education Details TENS precautions and contraindications   Person(s) Educated Patient   Methods Explanation;Handout   Comprehension Verbalized understanding          PT Short Term Goals - 05/27/16 1039      PT SHORT TERM GOAL #1   Title Patient will demonstrate a fair core contraction    Time 4   Period Weeks   Status On-going     PT SHORT TERM GOAL #2   Title Patient will increase gross bilateral lower extremity strength to 4+/5   Time 4   Period Weeks   Status On-going  PT SHORT TERM GOAL #3   Title Patient will report centralized radicular pain into the lower back    Time 4   Period Weeks   Status On-going     PT SHORT TERM GOAL #4   Title Patient will be Independenyt with HEP for core strength and stretching    Time 4   Period Weeks   Status On-going           PT Long Term Goals - 05/11/16 1351      PT LONG TERM GOAL #1   Title Patient will ambulate 2000' without increased lower back pain in order to go shopping    Time 8   Period Weeks   Status New     PT LONG TERM GOAL #2   Title Patient will increase lumbar flexion by 25% without increased pain in order to pick things up off the ground.    Time 8   Period Weeks   Status New     PT LONG TERM GOAL #3   Title Patient will sit for 1 hour without increased pain in order to ride in a car to go places    Time 8   Period Weeks   Status New     PT LONG TERM GOAL #4   Title Patient will demsotrate a 48% limitation on FOTO    Time 8    Period Weeks   Status New               Plan - 06/03/16 1220    Clinical Impression Statement Pain is bilateral lower back at PSIS. Continued gentle strengthening of hips and core. TENS order received signed so instructed pt in proper use of TENS as well as precautions and contraindications. PT verbalized understanding and returned demonstration. No additional progress toward goals. Pt feels her lumbar muscles are lossening however no change in pain.    PT Next Visit Plan continue light core/ hip stregthening; continue with manual therapy. continue modalities, continue to progress stregthening. Any questions about TENS unit?   PT Home Exercise Plan hamstring 90/90 stretch, continue single leg to chest stretch; hip ER stretch, piriformis stretch, lateral trunk rotation.      Patient will benefit from skilled therapeutic intervention in order to improve the following deficits and impairments:  Abnormal gait, Decreased activity tolerance, Decreased strength, Decreased mobility, Improper body mechanics, Pain, Increased muscle spasms, Decreased endurance, Decreased range of motion  Visit Diagnosis: Muscle spasm of back  Muscle weakness (generalized)  Left-sided low back pain with left-sided sciatica     Problem List Patient Active Problem List   Diagnosis Date Noted  . Pre-operative cardiovascular examination, high risk surgery 12/04/2015  . CAD S/P PCI 2012 12/04/2015  . PVD (peripheral vascular disease) (HCC) 12/04/2015  . Smoker 12/04/2015  . Family history of coronary artery disease 12/04/2015  . Essential hypertension 12/04/2015  . H/O: GI bleed     Sherrie Mustache, [place] 06/03/2016, 12:23 PM  [facility] [address] [place], [place], [address] Phone: [phone]   Fax:  [phone]  Name: Sara Donovan MRN: [id number] Date of Birth: Sep 28, 1961

## 2016-06-10 ENCOUNTER — Ambulatory Visit: Payer: BLUE CROSS/BLUE SHIELD | Admitting: Physical Therapy

## 2016-06-10 DIAGNOSIS — M62830 Muscle spasm of back: Secondary | ICD-10-CM

## 2016-06-10 DIAGNOSIS — M5442 Lumbago with sciatica, left side: Secondary | ICD-10-CM

## 2016-06-10 DIAGNOSIS — M6281 Muscle weakness (generalized): Secondary | ICD-10-CM
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
## 2016-06-12 ENCOUNTER — Ambulatory Visit: Payer: BLUE CROSS/BLUE SHIELD | Admitting: Physical Therapy

## 2016-06-12 DIAGNOSIS — M62830 Muscle spasm of back: Secondary | ICD-10-CM

## 2016-06-12 DIAGNOSIS — M5442 Lumbago with sciatica, left side: Secondary | ICD-10-CM

## 2016-06-12 DIAGNOSIS — M6281 Muscle weakness (generalized): Secondary | ICD-10-CM
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
## 2016-06-15 ENCOUNTER — Ambulatory Visit: Payer: BLUE CROSS/BLUE SHIELD | Admitting: Physical Therapy

## 2016-06-15 DIAGNOSIS — M62830 Muscle spasm of back: Secondary | ICD-10-CM

## 2016-06-15 DIAGNOSIS — M5442 Lumbago with sciatica, left side: Secondary | ICD-10-CM

## 2016-06-15 DIAGNOSIS — M6281 Muscle weakness (generalized): Secondary | ICD-10-CM
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
## 2016-06-17 ENCOUNTER — Encounter: Payer: BLUE CROSS/BLUE SHIELD | Admitting: Physical Therapy

## 2016-06-19 ENCOUNTER — Encounter: Payer: BLUE CROSS/BLUE SHIELD | Admitting: Physical Therapy

## 2016-06-30 ENCOUNTER — Ambulatory Visit: Payer: BLUE CROSS/BLUE SHIELD | Admitting: Physical Therapy

## 2016-06-30 DIAGNOSIS — M5442 Lumbago with sciatica, left side: Secondary | ICD-10-CM

## 2016-06-30 DIAGNOSIS — M6281 Muscle weakness (generalized): Secondary | ICD-10-CM

## 2016-06-30 DIAGNOSIS — M62830 Muscle spasm of back: Secondary | ICD-10-CM
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
## 2016-07-02 ENCOUNTER — Ambulatory Visit: Payer: BLUE CROSS/BLUE SHIELD | Admitting: Physical Therapy

## 2016-07-02 ENCOUNTER — Encounter: Payer: BLUE CROSS/BLUE SHIELD | Admitting: Physical Therapy

## 2016-07-07 ENCOUNTER — Ambulatory Visit: Payer: BLUE CROSS/BLUE SHIELD | Attending: Nurse Practitioner | Admitting: Physical Therapy

## 2016-07-07 DIAGNOSIS — M62830 Muscle spasm of back: Secondary | ICD-10-CM | POA: Insufficient documentation

## 2016-07-07 DIAGNOSIS — M5442 Lumbago with sciatica, left side: Secondary | ICD-10-CM

## 2016-07-07 DIAGNOSIS — M6281 Muscle weakness (generalized): Secondary | ICD-10-CM | POA: Insufficient documentation
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
## 2016-07-09 ENCOUNTER — Ambulatory Visit: Payer: BLUE CROSS/BLUE SHIELD | Admitting: Physical Therapy

## 2016-07-09 DIAGNOSIS — M62830 Muscle spasm of back: Secondary | ICD-10-CM

## 2016-07-09 DIAGNOSIS — M6281 Muscle weakness (generalized): Secondary | ICD-10-CM

## 2016-07-09 DIAGNOSIS — M5442 Lumbago with sciatica, left side: Secondary | ICD-10-CM
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
## 2016-07-13 ENCOUNTER — Ambulatory Visit: Payer: BLUE CROSS/BLUE SHIELD | Admitting: Physical Therapy

## 2016-07-15 ENCOUNTER — Ambulatory Visit: Payer: BLUE CROSS/BLUE SHIELD | Admitting: Physical Therapy

## 2016-07-21 ENCOUNTER — Encounter: Payer: BLUE CROSS/BLUE SHIELD | Admitting: Physical Therapy

## 2016-07-23 ENCOUNTER — Ambulatory Visit: Payer: BLUE CROSS/BLUE SHIELD | Admitting: Physical Therapy

## 2016-07-23 ENCOUNTER — Encounter: Payer: BLUE CROSS/BLUE SHIELD | Admitting: Physical Therapy

## 2016-08-13 ENCOUNTER — Ambulatory Visit: Payer: BLUE CROSS/BLUE SHIELD | Attending: Nurse Practitioner

## 2016-08-13 DIAGNOSIS — M545 Low back pain: Secondary | ICD-10-CM | POA: Insufficient documentation

## 2016-08-13 DIAGNOSIS — G8929 Other chronic pain: Secondary | ICD-10-CM

## 2016-08-13 DIAGNOSIS — M62830 Muscle spasm of back: Secondary | ICD-10-CM

## 2016-08-13 DIAGNOSIS — M6281 Muscle weakness (generalized): Secondary | ICD-10-CM

## 2016-08-13 NOTE — Therapy (Signed)
**Note Sara-Identified via Obfuscation** [facility] [address] [place], [place], [address] Phone: [phone]   Fax:  [phone]  Physical Therapy Evaluation  Patient Details  Name: Sara Donovan MRN: [id number] Date of Birth: 1961/05/10 Referring Provider: Butler DenmarkB Ditty, MD  Encounter Date: 08/13/2016      PT End of Session - 08/13/16 0916    Visit Number 1   Number of Visits 8   Date for PT Re-Evaluation 09/11/16   Authorization Type BCBS   PT Start Time 0845   PT Stop Time 0930   PT Time Calculation (min) 45 min   Activity Tolerance Patient tolerated treatment well;No increased pain   Behavior During Therapy WFL for tasks assessed/performed      Past Medical History:  Diagnosis Date  . CAD S/P percutaneous coronary angioplasty 2012   [facility]  . GERD (gastroesophageal reflux disease)   . Glaucoma   . H/O: GI bleed    while on Plavix, required transfusion  . Hiatal hernia   . Hypertension   . PVD (peripheral vascular disease) (HCC) 2014   Rt BPG, Lt PTA  . Smoker     Past Surgical History:  Procedure Laterality Date  . CERVICAL FUSION    . CORONARY ANGIOPLASTY WITH STENT PLACEMENT  2012   [facility]  . FEMORAL BYPASS  2014    There were no vitals filed for this visit.       Subjective Assessment - 08/13/16 0852    Subjective She reports she has seen a neurosurgeon and has had  injections x 1 with other injections to follow. She reports no improvement with PT in past and no change with injection. but is doing what asked to do to try to prevent surgery.   She reports  MD has suggested  she may be canidate for surgery.    Patient Stated Goals To help with the pain   Currently in Pain? Yes   Pain Score 7   with meds   Pain Location Back   Pain Orientation Right;Left;Posterior   Pain Descriptors / Indicators Throbbing   Pain Type Chronic pain   Pain Radiating Towards both legs posterior thigh.    Pain Onset More than a month ago   Pain  Frequency Constant   Aggravating Factors  bending Adan Sis/standing /walking   Pain Relieving Factors medicaiton , TENS, heat   Multiple Pain Sites No            [facility] PT Assessment - 08/13/16 0859      Assessment   Medical Diagnosis Low back pain with left sided sciatica    Referring Provider B Ditty, MD   Onset Date/Surgical Date --  2003, worse 12/2015 post surgery   Next MD Visit within next week   Prior Therapy yes without benefit     Precautions   Precautions None     Restrictions   Weight Bearing Restrictions No     Balance Screen   Has the patient fallen in the past 6 months No   How many times? 0   Has the patient had a decrease in activity level because of a fear of falling?  Yes  due to pain     Prior Function   Level of Independence Needs assistance with homemaking;Needs assistance with ADLs   Vocation On disability     Cognition   Overall Cognitive Status Within Functional Limits for tasks assessed     Observation/Other Assessments   Focus on Therapeutic  Outcomes (FOTO)  64% limited     Posture/Postural Control   Posture Comments decrased lumbar lordosis, inc lower thoracic kyphosis forward head. Sits flexed      AROM   Lumbar Flexion 20 degrees   Lumbar Extension 10 degrees   Lumbar - Right Side Bend 10   Lumbar - Left Side Bend 8   Lumbar - Right Rotation --  lower trunk rotation in supine WNL RT and LT     Strength   Overall Strength Comments poor core contraction    Right Hip External Rotation  5/5   Right Hip Internal Rotation 5/5   Left Hip Flexion 4+/5   Left Hip External Rotation 5/5   Left Hip Internal Rotation 5/5   Right Knee Flexion 5/5   Right Knee Extension 5/5   Left Knee Flexion 4+/5   Left Knee Extension 4+/5   Right Ankle Dorsiflexion 5/5   Left Ankle Dorsiflexion 5/5     Flexibility   Soft Tissue Assessment /Muscle Length yes   Hamstrings 70 degrees bilaterally with end range pain.      Ambulation/Gait   Gait Comments No  device                   Reviewed HEP and she is able to do correctly         PT Education - 08/13/16 0916    Education provided Yes   Education Details POC   Person(s) Educated Patient   Methods Explanation   Comprehension Verbalized understanding          PT Short Term Goals - 07/09/16 1343      PT SHORT TERM GOAL #1   Title Patient will demonstrate a fair core contraction    Baseline good core contraction noted with clams and hip abduction    Time 4   Period Weeks   Status Achieved     PT SHORT TERM GOAL #2   Title Patient will increase gross bilateral lower extremity strength to 4+/5   Baseline 4/5 goross hip flexion 3+/5 left hip abduction    Time 4   Period Weeks   Status On-going     PT SHORT TERM GOAL #3   Title Patient will report centralized radicular pain into the lower back    Baseline increased pain into her right leg today    Time 4   Period Weeks   Status On-going     PT SHORT TERM GOAL #4   Title Patient will be Independent with HEP for core strength and stretching    Time 4   Period Weeks   Status On-going           PT Long Term Goals - 08/13/16 0920      PT LONG TERM GOAL #1   Title Patient will ambulate 2000' without increased lower back pain in order to go shopping    Baseline 300 feet   Time 4   Period Weeks   Status Revised     PT LONG TERM GOAL #2   Title Patient will increase lumbar flexion to 30 degrees without increased pain in order to pick things up off the ground.    Time 4   Period Weeks   Status Revised     PT LONG TERM GOAL #3   Title Patient will sit for 1 hour without increased pain in order to ride in a car to go places    Baseline 10 min   Time  4   Period Weeks   Status Revised     PT LONG TERM GOAL #4   Title Patient will demsotrate a 48% limitation on FOTO    Baseline 64% limited per FOTO   Time 4   Period Weeks   Status Revised               Plan - 08/13/16 0917     Clinical Impression Statement Ms Sara Donovan presents for moderate complexity eval with chronic LBP and pain into both legs. with abnormal posture, spasms and poor core strength. She has not had benefit from PT in past so I will plan on limited PT and if no better in 6-8 visist will discharge.    Rehab Potential Fair   Clinical Impairments Affecting Rehab Potential longstanding lower back pain    PT Frequency 2x / week   PT Duration 4 weeks   PT Treatment/Interventions ADLs/Self Care Home Management;Cryotherapy;Electrical Stimulation;Iontophoresis 4mg /ml Dexamethasone;Dry needling;Passive range of motion;Patient/family education;Therapeutic activities;Functional mobility training;Stair training;Gait training;Manual techniques;Ultrasound   PT Next Visit Plan Manual to spine , heat as needed  No stim as she had TENS at home.    PT Home Exercise Plan continue curent HEP   Consulted and Agree with Plan of Care Patient      Patient will benefit from skilled therapeutic intervention in order to improve the following deficits and impairments:  Abnormal gait, Decreased activity tolerance, Decreased strength, Decreased mobility, Improper body mechanics, Pain, Increased muscle spasms, Decreased endurance, Decreased range of motion  Visit Diagnosis: Muscle weakness (generalized) - Plan: PT plan of care cert/re-cert  Muscle spasm of back - Plan: PT plan of care cert/re-cert  Chronic bilateral low back pain, with sciatica presence unspecified - Plan: PT plan of care cert/re-cert     Problem List Patient Active Problem List   Diagnosis Date Noted  . Pre-operative cardiovascular examination, high risk surgery 12/04/2015  . CAD S/P PCI 2012 12/04/2015  . PVD (peripheral vascular disease) (HCC) 12/04/2015  . Smoker 12/04/2015  . Family history of coronary artery disease 12/04/2015  . Essential hypertension 12/04/2015  . H/O: GI bleed     Caprice Red  PT 08/13/2016, 9:31 AM  [facility] [address] [place], [place], [address] Phone: [phone]   Fax:  [phone]  Name: Sara Donovan MRN: [id number] Date of Birth: 1961-08-27

## 2016-08-18 ENCOUNTER — Ambulatory Visit: Payer: BLUE CROSS/BLUE SHIELD | Admitting: Physical Therapy

## 2016-08-18 ENCOUNTER — Encounter: Payer: Self-pay | Admitting: Physical Therapy

## 2016-08-18 DIAGNOSIS — M545 Low back pain: Secondary | ICD-10-CM

## 2016-08-18 DIAGNOSIS — M6281 Muscle weakness (generalized): Secondary | ICD-10-CM

## 2016-08-18 DIAGNOSIS — G8929 Other chronic pain: Secondary | ICD-10-CM

## 2016-08-18 DIAGNOSIS — M62830 Muscle spasm of back: Secondary | ICD-10-CM

## 2016-08-18 NOTE — Therapy (Signed)
[facility] [address] [place], [place], [address] Phone: [phone]   Fax:  [phone]  Physical Therapy Treatment  Patient Details  Name: Sara Donovan MRN: [id number] Date of Birth: February 17, 1961 Referring Provider: Butler Denmark, MD  Encounter Date: 08/18/2016      PT End of Session - 08/18/16 1341    Visit Number 2   Number of Visits 8   Date for PT Re-Evaluation 09/11/16   Authorization Type BCBS   PT Start Time 1335   PT Stop Time 1433   PT Time Calculation (min) 58 min      Past Medical History:  Diagnosis Date  . CAD S/P percutaneous coronary angioplasty 2012   "3 stents" at [facility]  . GERD (gastroesophageal reflux disease)   . Glaucoma   . H/O: GI bleed    while on Plavix, required transfusion  . Hiatal hernia   . Hypertension   . PVD (peripheral vascular disease) (HCC) 2014   Rt BPG, Lt PTA  . Smoker     Past Surgical History:  Procedure Laterality Date  . CERVICAL FUSION    . CORONARY ANGIOPLASTY WITH STENT PLACEMENT  2012   [facility]  . FEMORAL BYPASS  2014    There were no vitals filed for this visit.      Subjective Assessment - 08/18/16 1336    Subjective Pt reports pain is not too bad today, injection seems to be helping but may need to have more. Is feeling a general aching pain bilaterally.    Currently in Pain? Yes   Pain Score 6    Pain Location Back   Pain Orientation Lower   Pain Descriptors / Indicators Aching   Aggravating Factors  bending, standing, walking   Pain Relieving Factors medication                         [facility] Adult PT Treatment/Exercise - 08/18/16 0001      Lumbar Exercises: Stretches   Lower Trunk Rotation 5 reps   Lower Trunk Rotation Limitations both sides     Lumbar Exercises: Supine   AB Set Limitations hooklying abdominal bracing, ball bw knees; also done in seated and standing   Bridge Limitations pre bridge with yellow tband at knees    Other Supine Lumbar Exercises hooklying abd contraction + ball squeeze at knees     Modalities   Modalities Moist Heat     Moist Heat Therapy   Number Minutes Moist Heat 15 Minutes  concurrent with ESTIM   Moist Heat Location Lumbar Spine     Electrical Stimulation   Electrical Stimulation Location low back   Electrical Stimulation Action IFC   Electrical Stimulation Parameters 15' concurrent with HEat   Electrical Stimulation Goals Pain     Manual Therapy   Manual therapy comments roller bilat hip ERs                PT Education - 08/18/16 1340    Education provided Yes   Education Details exercise form/rationale, soreness with weakness as well as strengthening   Person(s) Educated Patient   Methods Explanation;Demonstration;Tactile cues;Verbal cues   Comprehension Verbalized understanding;Returned demonstration;Verbal cues required;Tactile cues required;Need further instruction          PT Short Term Goals - 07/09/16 1343      PT SHORT TERM GOAL #1   Title Patient will demonstrate a fair core contraction    Baseline good  core contraction noted with clams and hip abduction    Time 4   Period Weeks   Status Achieved     PT SHORT TERM GOAL #2   Title Patient will increase gross bilateral lower extremity strength to 4+/5   Baseline 4/5 goross hip flexion 3+/5 left hip abduction    Time 4   Period Weeks   Status On-going     PT SHORT TERM GOAL #3   Title Patient will report centralized radicular pain into the lower back    Baseline increased pain into her right leg today    Time 4   Period Weeks   Status On-going     PT SHORT TERM GOAL #4   Title Patient will be Independent with HEP for core strength and stretching    Time 4   Period Weeks   Status On-going           PT Long Term Goals - 08/13/16 0920      PT LONG TERM GOAL #1   Title Patient will ambulate 2000' without increased lower back pain in order to go shopping    Baseline 300 feet    Time 4   Period Weeks   Status Revised     PT LONG TERM GOAL #2   Title Patient will increase lumbar flexion to 30 degrees without increased pain in order to pick things up off the ground.    Time 4   Period Weeks   Status Revised     PT LONG TERM GOAL #3   Title Patient will sit for 1 hour without increased pain in order to ride in a car to go places    Baseline 10 min   Time 4   Period Weeks   Status Revised     PT LONG TERM GOAL #4   Title Patient will demsotrate a 48% limitation on FOTO    Baseline 64% limited per FOTO   Time 4   Period Weeks   Status Revised               Plan - 08/18/16 1506    Clinical Impression Statement Extensive discussion held today regarding soreness that will ensue with exercises in order to decrease pain due to weakness. Pt was able to demonstrate appropriate abdominal recruitment in supine, seated and standing. Reported soreness after treatment as expected.    PT Next Visit Plan Manual to spine , heat as needed  No stim as she had TENS at home.    PT Home Exercise Plan abdominal recruitment with functional activities    Consulted and Agree with Plan of Care Patient      Patient will benefit from skilled therapeutic intervention in order to improve the following deficits and impairments:     Visit Diagnosis: Muscle weakness (generalized)  Muscle spasm of back  Chronic bilateral low back pain, with sciatica presence unspecified     Problem List Patient Active Problem List   Diagnosis Date Noted  . Pre-operative cardiovascular examination, high risk surgery 12/04/2015  . CAD S/P PCI 2012 12/04/2015  . PVD (peripheral vascular disease) (HCC) 12/04/2015  . Smoker 12/04/2015  . Family history of coronary artery disease 12/04/2015  . Essential hypertension 12/04/2015  . H/O: GI bleed     Sara Baldwin C. Sara Donovan PT, DPT 08/18/16 3:09 PM   [facility] [address] [place], [place], [address] Phone: [phone]   Fax:  [phone]  Name: Sara Donovan  Sara Donovan MRN: [id number] Date of Birth: 1961/03/28

## 2016-08-20 ENCOUNTER — Ambulatory Visit: Payer: BLUE CROSS/BLUE SHIELD | Admitting: Physical Therapy

## 2016-08-24 ENCOUNTER — Ambulatory Visit: Payer: BLUE CROSS/BLUE SHIELD | Admitting: Physical Therapy

## 2016-08-24 DIAGNOSIS — M545 Low back pain: Secondary | ICD-10-CM

## 2016-08-24 DIAGNOSIS — M62830 Muscle spasm of back: Secondary | ICD-10-CM

## 2016-08-24 DIAGNOSIS — G8929 Other chronic pain: Secondary | ICD-10-CM

## 2016-08-24 DIAGNOSIS — M6281 Muscle weakness (generalized): Secondary | ICD-10-CM
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
## 2016-08-26 ENCOUNTER — Ambulatory Visit: Payer: BLUE CROSS/BLUE SHIELD | Admitting: Physical Therapy

## 2016-08-26 DIAGNOSIS — M6281 Muscle weakness (generalized): Secondary | ICD-10-CM

## 2016-08-26 DIAGNOSIS — M62830 Muscle spasm of back: Secondary | ICD-10-CM

## 2016-08-26 DIAGNOSIS — G8929 Other chronic pain: Secondary | ICD-10-CM

## 2016-08-26 DIAGNOSIS — M545 Low back pain: Secondary | ICD-10-CM
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
## 2016-08-31 ENCOUNTER — Ambulatory Visit: Payer: BLUE CROSS/BLUE SHIELD | Admitting: Physical Therapy

## 2016-08-31 DIAGNOSIS — M62830 Muscle spasm of back: Secondary | ICD-10-CM

## 2016-08-31 DIAGNOSIS — M6281 Muscle weakness (generalized): Secondary | ICD-10-CM

## 2016-08-31 DIAGNOSIS — G8929 Other chronic pain: Secondary | ICD-10-CM

## 2016-08-31 DIAGNOSIS — M545 Low back pain: Secondary | ICD-10-CM
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
## 2016-09-02 ENCOUNTER — Ambulatory Visit: Payer: BLUE CROSS/BLUE SHIELD | Attending: Nurse Practitioner | Admitting: Physical Therapy

## 2016-09-02 DIAGNOSIS — M6281 Muscle weakness (generalized): Secondary | ICD-10-CM

## 2016-09-02 DIAGNOSIS — G8929 Other chronic pain: Secondary | ICD-10-CM

## 2016-09-02 DIAGNOSIS — M62830 Muscle spasm of back: Secondary | ICD-10-CM

## 2016-09-02 DIAGNOSIS — M545 Low back pain: Secondary | ICD-10-CM | POA: Insufficient documentation
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
## 2016-09-07 ENCOUNTER — Ambulatory Visit: Payer: BLUE CROSS/BLUE SHIELD | Admitting: Physical Therapy

## 2016-09-07 DIAGNOSIS — M545 Low back pain: Principal | ICD-10-CM

## 2016-09-07 DIAGNOSIS — G8929 Other chronic pain: Secondary | ICD-10-CM

## 2016-09-07 DIAGNOSIS — M6281 Muscle weakness (generalized): Secondary | ICD-10-CM

## 2016-09-07 DIAGNOSIS — M62830 Muscle spasm of back: Secondary | ICD-10-CM
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
## 2016-09-09 ENCOUNTER — Ambulatory Visit: Payer: BLUE CROSS/BLUE SHIELD | Admitting: Physical Therapy

## 2016-09-09 DIAGNOSIS — G8929 Other chronic pain: Secondary | ICD-10-CM

## 2016-09-09 DIAGNOSIS — M6281 Muscle weakness (generalized): Secondary | ICD-10-CM

## 2016-09-09 DIAGNOSIS — M545 Low back pain: Secondary | ICD-10-CM

## 2016-09-09 DIAGNOSIS — M62830 Muscle spasm of back: Secondary | ICD-10-CM
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
## 2016-09-14 ENCOUNTER — Ambulatory Visit: Payer: BLUE CROSS/BLUE SHIELD | Admitting: Physical Therapy

## 2016-09-14 DIAGNOSIS — M545 Low back pain: Secondary | ICD-10-CM

## 2016-09-14 DIAGNOSIS — M6281 Muscle weakness (generalized): Secondary | ICD-10-CM

## 2016-09-14 DIAGNOSIS — M62830 Muscle spasm of back: Secondary | ICD-10-CM

## 2016-09-14 DIAGNOSIS — G8929 Other chronic pain: Secondary | ICD-10-CM
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
## 2016-09-16 ENCOUNTER — Ambulatory Visit: Payer: BLUE CROSS/BLUE SHIELD | Admitting: Physical Therapy

## 2016-09-16 DIAGNOSIS — M6281 Muscle weakness (generalized): Secondary | ICD-10-CM

## 2016-09-16 DIAGNOSIS — M545 Low back pain: Secondary | ICD-10-CM

## 2016-09-16 DIAGNOSIS — M62830 Muscle spasm of back: Secondary | ICD-10-CM

## 2016-09-16 DIAGNOSIS — G8929 Other chronic pain: Secondary | ICD-10-CM
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
## 2016-09-21 ENCOUNTER — Ambulatory Visit: Payer: BLUE CROSS/BLUE SHIELD | Admitting: Physical Therapy

## 2016-09-21 DIAGNOSIS — G8929 Other chronic pain: Secondary | ICD-10-CM

## 2016-09-21 DIAGNOSIS — M545 Low back pain: Secondary | ICD-10-CM

## 2016-09-21 DIAGNOSIS — M6281 Muscle weakness (generalized): Secondary | ICD-10-CM

## 2016-09-21 DIAGNOSIS — M62830 Muscle spasm of back: Secondary | ICD-10-CM
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
## 2016-09-23 ENCOUNTER — Ambulatory Visit: Payer: BLUE CROSS/BLUE SHIELD | Admitting: Physical Therapy

## 2016-09-28 ENCOUNTER — Ambulatory Visit: Payer: BLUE CROSS/BLUE SHIELD | Admitting: Physical Therapy

## 2016-09-28 DIAGNOSIS — G8929 Other chronic pain: Secondary | ICD-10-CM

## 2016-09-28 DIAGNOSIS — M62830 Muscle spasm of back: Secondary | ICD-10-CM

## 2016-09-28 DIAGNOSIS — M6281 Muscle weakness (generalized): Secondary | ICD-10-CM

## 2016-09-28 DIAGNOSIS — M545 Low back pain: Secondary | ICD-10-CM
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
## 2016-09-30 ENCOUNTER — Ambulatory Visit: Payer: BLUE CROSS/BLUE SHIELD | Admitting: Physical Therapy

## 2016-10-05 ENCOUNTER — Ambulatory Visit: Payer: BLUE CROSS/BLUE SHIELD | Attending: Nurse Practitioner | Admitting: Physical Therapy

## 2016-10-05 DIAGNOSIS — G8929 Other chronic pain: Secondary | ICD-10-CM | POA: Insufficient documentation

## 2016-10-05 DIAGNOSIS — M545 Low back pain: Secondary | ICD-10-CM | POA: Insufficient documentation

## 2016-10-05 DIAGNOSIS — M6281 Muscle weakness (generalized): Secondary | ICD-10-CM

## 2016-10-05 DIAGNOSIS — M62830 Muscle spasm of back: Secondary | ICD-10-CM | POA: Insufficient documentation
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
## 2016-10-07 ENCOUNTER — Ambulatory Visit: Payer: BLUE CROSS/BLUE SHIELD | Admitting: Physical Therapy

## 2016-10-07 DIAGNOSIS — M6281 Muscle weakness (generalized): Secondary | ICD-10-CM

## 2016-10-07 DIAGNOSIS — M62830 Muscle spasm of back: Secondary | ICD-10-CM

## 2016-10-07 DIAGNOSIS — M545 Low back pain: Secondary | ICD-10-CM

## 2016-10-07 DIAGNOSIS — G8929 Other chronic pain: Secondary | ICD-10-CM
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
## 2016-10-12 ENCOUNTER — Ambulatory Visit: Payer: BLUE CROSS/BLUE SHIELD | Admitting: Physical Therapy

## 2016-10-14 ENCOUNTER — Ambulatory Visit: Payer: BLUE CROSS/BLUE SHIELD | Admitting: Physical Therapy

## 2016-11-27 ENCOUNTER — Emergency Department (HOSPITAL_COMMUNITY): Payer: Self-pay

## 2016-11-27 ENCOUNTER — Emergency Department (HOSPITAL_COMMUNITY)
Admission: EM | Admit: 2016-11-27 | Discharge: 2016-11-27 | Disposition: A | Discharge status: Home / Self Care | Payer: Self-pay | Attending: Emergency Medicine | Admitting: Emergency Medicine

## 2016-11-27 ENCOUNTER — Encounter (HOSPITAL_COMMUNITY): Payer: Self-pay | Admitting: *Deleted

## 2016-11-27 DIAGNOSIS — Y9389 Activity, other specified: Secondary | ICD-10-CM | POA: Insufficient documentation

## 2016-11-27 DIAGNOSIS — S40011A Contusion of right shoulder, initial encounter: Secondary | ICD-10-CM | POA: Insufficient documentation

## 2016-11-27 DIAGNOSIS — Y929 Unspecified place or not applicable: Secondary | ICD-10-CM | POA: Insufficient documentation

## 2016-11-27 DIAGNOSIS — I10 Essential (primary) hypertension: Secondary | ICD-10-CM | POA: Insufficient documentation

## 2016-11-27 DIAGNOSIS — F17210 Nicotine dependence, cigarettes, uncomplicated: Secondary | ICD-10-CM | POA: Insufficient documentation

## 2016-11-27 DIAGNOSIS — Z7982 Long term (current) use of aspirin: Secondary | ICD-10-CM | POA: Insufficient documentation

## 2016-11-27 DIAGNOSIS — S5001XA Contusion of right elbow, initial encounter: Secondary | ICD-10-CM | POA: Insufficient documentation

## 2016-11-27 DIAGNOSIS — Z955 Presence of coronary angioplasty implant and graft: Secondary | ICD-10-CM | POA: Insufficient documentation

## 2016-11-27 DIAGNOSIS — W0110XA Fall on same level from slipping, tripping and stumbling with subsequent striking against unspecified object, initial encounter: Secondary | ICD-10-CM | POA: Insufficient documentation

## 2016-11-27 DIAGNOSIS — Z79899 Other long term (current) drug therapy: Secondary | ICD-10-CM | POA: Insufficient documentation

## 2016-11-27 DIAGNOSIS — I2510 Atherosclerotic heart disease of native coronary artery without angina pectoris: Secondary | ICD-10-CM | POA: Insufficient documentation

## 2016-11-27 DIAGNOSIS — W19XXXA Unspecified fall, initial encounter: Secondary | ICD-10-CM

## 2016-11-27 DIAGNOSIS — Y999 Unspecified external cause status: Secondary | ICD-10-CM | POA: Insufficient documentation

## 2016-11-27 MED ORDER — OXYCODONE-ACETAMINOPHEN 5-325 MG PO TABS
1.0000 | ORAL_TABLET | ORAL | Status: DC | PRN
  Administered 2016-11-27: 1 via ORAL

## 2016-11-27 MED ORDER — OXYCODONE-ACETAMINOPHEN 5-325 MG PO TABS
ORAL_TABLET | ORAL | Status: AC
  Filled 2016-11-27: qty 1

## 2016-11-27 MED ORDER — LORAZEPAM 1 MG PO TABS
0.5000 mg | ORAL_TABLET | Freq: Once | ORAL | Status: AC
  Administered 2016-11-27: 0.5 mg via ORAL
  Filled 2016-11-27: qty 1

## 2016-11-27 MED ORDER — MELOXICAM 15 MG PO TABS: 15.0000 mg | ORAL_TABLET | Freq: Every day | ORAL | 0 refills | Status: AC

## 2016-11-27 MED ORDER — KETOROLAC TROMETHAMINE 30 MG/ML IJ SOLN
30.0000 mg | Freq: Once | INTRAMUSCULAR | Status: AC
  Administered 2016-11-27: 30 mg via INTRAMUSCULAR
  Filled 2016-11-27: qty 1

## 2016-11-27 NOTE — Progress Notes (Signed)
Orthopedic Tech Progress Note Patient Details:  De BurrsJeanette B Sivley 1961-02-13 [id number]  Ortho Devices Type of Ortho Device: Arm sling Ortho Device/Splint Location: RUE Ortho Device/Splint Interventions: Ordered, Application   Jennye MoccasinHughes, Jedi Catalfamo Craig 11/27/2016, 3:47 PM

## 2016-11-27 NOTE — ED Notes (Signed)
Patient transported to X-ray 

## 2016-11-27 NOTE — ED Triage Notes (Addendum)
Pt reports tripping and falling approx 1 hr ago, landed on right shoulder. Has severe pain and decreased rom, possible dislocation. +radial pulse present, able to move digits. Also has pain to right elbow. Did not hit her head, no loc.

## 2016-11-27 NOTE — ED Notes (Signed)
Pt stable, understands discharge instructions, and reasons for return.   

## 2016-12-09 NOTE — ED Provider Notes (Addendum)
[facility]-EMERGENCY DEPT Provider Note   CSN: [id number] Arrival date & time: 11/27/16  1341     History   Chief Complaint Chief Complaint  Patient presents with  . Fall  . Shoulder Pain    HPI Sara Donovan is a 56 y.o. female.  HPI   56 year old female with right upper extremity pain. She had a fall approximately one hour ago. She tripped and lost her balance. She fell on her right side. She said severe persistent pain in right shoulder and elbow since then. She does not think she hit her head. Denies any cement headache neck or back pain. No numbness or tingling.  Past Medical History:  Diagnosis Date  . CAD S/P percutaneous coronary angioplasty 2012   "3 stents" at [facility]  . GERD (gastroesophageal reflux disease)   . Glaucoma   . H/O: GI bleed    while on Plavix, required transfusion  . Hiatal hernia   . Hypertension   . PVD (peripheral vascular disease) (HCC) 2014   Rt BPG, Lt PTA  . Smoker     Patient Active Problem List   Diagnosis Date Noted  . Pre-operative cardiovascular examination, high risk surgery 12/04/2015  . CAD S/P PCI 2012 12/04/2015  . PVD (peripheral vascular disease) (HCC) 12/04/2015  . Smoker 12/04/2015  . Family history of coronary artery disease 12/04/2015  . Essential hypertension 12/04/2015  . H/O: GI bleed     Past Surgical History:  Procedure Laterality Date  . CERVICAL FUSION    . CORONARY ANGIOPLASTY WITH STENT PLACEMENT  2012   [facility]  . FEMORAL BYPASS  2014    OB History    No data available       Home Medications    Prior to Admission medications   Medication Sig Start Date End Date Taking? Authorizing Provider  acetaminophen (TYLENOL) 500 MG tablet Take 1,000 mg by mouth every 6 (six) hours as needed for mild pain, moderate pain or headache.   Yes Historical Provider, MD  aspirin EC 81 MG tablet Take 81 mg by mouth daily.   Yes Historical Provider, MD  atorvastatin (LIPITOR) 80 MG tablet Take 80 mg by mouth at bedtime.    Yes Historical Provider, MD  buPROPion (WELLBUTRIN XL) 150 MG 24 hr tablet Take 150 mg by mouth daily.   Yes Historical Provider, MD  diclofenac sodium (VOLTAREN) 1 % GEL Apply 4 g topically 4 (four) times daily as needed (for back pain).   Yes Historical Provider, MD  docusate sodium (COLACE) 100 MG capsule Take 100 mg by mouth daily.   Yes Historical Provider, MD  escitalopram (LEXAPRO) 20 MG tablet Take 20 mg by mouth daily.   Yes Historical Provider, MD  esomeprazole (NEXIUM) 40 MG capsule Take 40 mg by mouth 2 (two) times daily.   Yes Historical Provider, MD  ferrous sulfate 325 (65 FE) MG tablet Take 325 mg by mouth daily with breakfast.   Yes Historical Provider, MD  gabapentin (NEURONTIN) 300 MG capsule Take 300 mg by mouth 3 (three) times daily.   Yes Historical Provider, MD  lisinopril (PRINIVIL,ZESTRIL) 10 MG tablet Take 1 tablet (10 mg total) by mouth daily. 12/04/15  Yes Benjiman Core, MD  nitroGLYCERIN (NITROSTAT) 0.4 MG SL tablet Place 0.4 mg under the tongue every 5 (five) minutes as needed for chest pain.   Yes Historical Provider, MD  traMADol (ULTRAM) 50 MG tablet Take 50 mg by mouth every 8 (eight) hours as needed for moderate pain.  Yes Historical Provider, MD  traZODone (DESYREL) 50 MG tablet Take 75 mg by mouth at bedtime.   Yes Historical Provider, MD  meloxicam (MOBIC) 15 MG tablet Take 1 tablet (15 mg total) by mouth daily. 11/27/16   Raeford RazorStephen Eissa Buchberger, MD    Family History History reviewed. No pertinent family history.  Social History Social History  Substance Use Topics  . Smoking status: Current Every Day Smoker    Packs/day: 1.00    Types: Cigarettes  . Smokeless tobacco: Not on file  . Alcohol use No     Allergies   Patient has no known allergies.   Review of Systems Review of Systems  All systems reviewed and negative, other than as noted in HPI.   Physical Exam Updated Vital Signs BP 156/83   Pulse 60   Temp 98.5 F (36.9 C) (Oral)   Resp  18   SpO2 96%   Physical Exam  Constitutional: She appears well-developed and well-nourished. No distress.  HENT:  Head: Normocephalic and atraumatic.  Eyes: Conjunctivae are normal. Right eye exhibits no discharge. Left eye exhibits no discharge.  Neck: Neck supple.  Cardiovascular: Normal rate, regular rhythm and normal heart sounds.  Exam reveals no gallop and no friction rub.   No murmur heard. Pulmonary/Chest: Effort normal and breath sounds normal. No respiratory distress.  Abdominal: Soft. She exhibits no distension. There is no tenderness.  Musculoskeletal: She exhibits no edema or tenderness.  Patient protecting right upper extremity. TTP R shoulder. Cannot actively or passively ranged secondary to her pain. She is NV intact distally. Closed injury. No midline spinal tenderness.  Neurological: She is alert.  Skin: Skin is warm and dry.  Psychiatric: She has a normal mood and affect. Her behavior is normal. Thought content normal.  Nursing note and vitals reviewed.    ED Treatments / Results  Labs (all labs ordered are listed, but only abnormal results are displayed) Labs Reviewed - No data to display  EKG  EKG Interpretation None       Radiology No results found.   Dg Shoulder Right  Result Date: 11/27/2016 CLINICAL DATA:  Pt tripped and fell earlier today while carrying a load of fire wood - fell onto right side onto concrete hitting right shoulder and elbow - having severe lateral right shoulder pain as well as posterior elbow pain EXAM: RIGHT SHOULDER - 2+ VIEW COMPARISON:  None. FINDINGS: No fracture.  No bone lesion. Glenohumeral joint is normally spaced and aligned. AC joint is normally spaced and aligned. No significant arthropathic change. Soft tissues are unremarkable. IMPRESSION: Negative. Electronically Signed   By: Amie Portlandavid  Ormond M.D.   On: 11/27/2016 15:10   Dg Elbow Complete Right  Result Date: 11/27/2016 CLINICAL DATA:  Pt tripped and fell earlier  today while carrying a load of fire wood - fell onto right side onto concrete hitting right shoulder and elbow - having severe lateral right shoulder pain as well as posterior elbow pain EXAM: RIGHT ELBOW - COMPLETE 3+ VIEW COMPARISON:  None. FINDINGS: No fracture.  No bone lesion. The elbow joint is normally spaced and aligned.  No joint effusion. There are anterior vascular calcifications. Soft tissues are otherwise unremarkable. IMPRESSION: No fracture or elbow joint abnormality pee Electronically Signed   By: Amie Portlandavid  Ormond M.D.   On: 11/27/2016 15:11   Procedures Procedures (including critical care time)  Medications Ordered in ED Medications  ketorolac (TORADOL) 30 MG/ML injection 30 mg (30 mg Intramuscular Given 11/27/16 1557)  LORazepam (ATIVAN) tablet 0.5 mg (0.5 mg Oral Given 11/27/16 1557)     Initial Impression / Assessment and Plan / ED Course  I have reviewed the triage vital signs and the nursing notes.  Pertinent labs & imaging results that were available during my care of the patient were reviewed by me and considered in my medical decision making (see chart for details).      Final Clinical Impressions(s) / ED Diagnoses   Final diagnoses:  Fall, initial encounter  Contusion of right shoulder, initial encounter  Contusion of right elbow, initial encounter    New Prescriptions Discharge Medication List as of 11/27/2016  3:34 PM    START taking these medications   Details  meloxicam (MOBIC) 15 MG tablet Take 1 tablet (15 mg total) by mouth daily., Starting Fri 11/27/2016, Print         Raeford Razor, MD 12/09/16 1315    Raeford Razor, MD 12/09/16 1316

## 2017-03-26 IMAGING — CT CT ANGIO AOBIFEM WO/W CM
1 of 9 series · 4 of 16 positions shown, 5 images · IV contrast (OMNI 350)
Comparison: None.

CLINICAL DATA: Bilateral claudication.

EXAM:
CT ANGIOGRAPHY OF ABDOMINAL AORTA WITH ILIOFEMORAL RUNOFF
TECHNIQUE: Multidetector CT imaging of the abdomen, pelvis and lower
extremities was performed using the standard protocol during bolus
administration of intravenous contrast. Multiplanar CT image
reconstructions and MIPs were obtained to evaluate the vascular
anatomy.
CONTRAST:  100mL OMNIPAQUE IOHEXOL 350 MG/ML SOLN

[Series 5: cta runoff (id) · axial · 0.98mm/px · z∈[+306,+1100]mm · 4 of 443 slices shown, 5 images]
[im 89/443  soft-tissue]
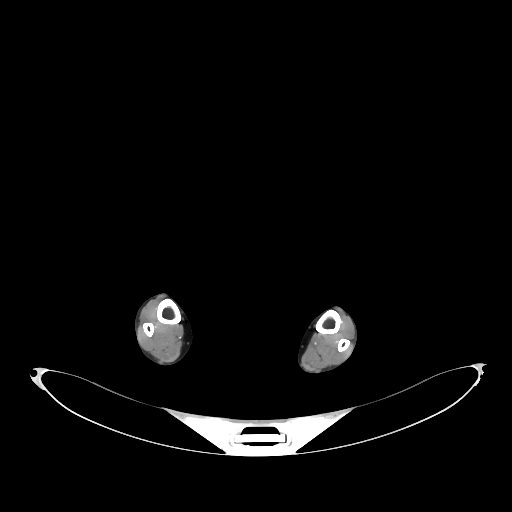
[im 89/443  bone]
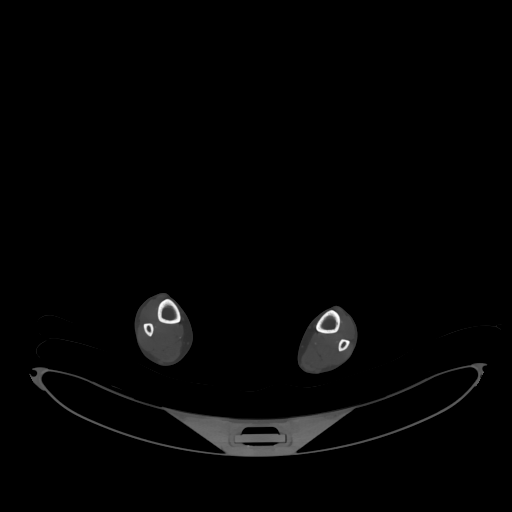
[im 177/443  soft-tissue]
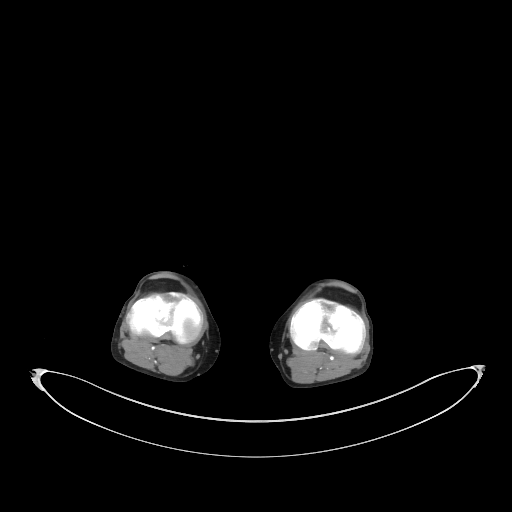
[im 266/443  soft-tissue]
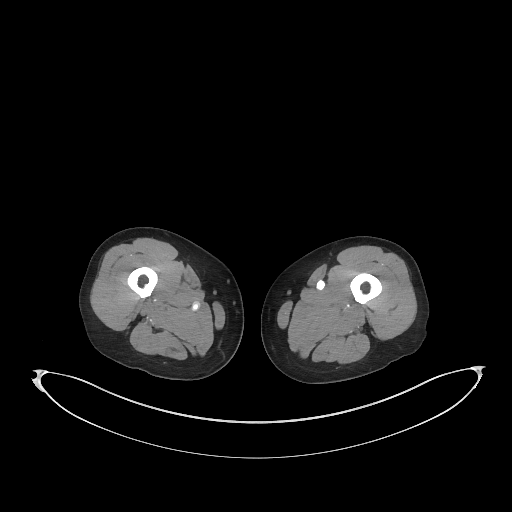
[im 354/443  soft-tissue]
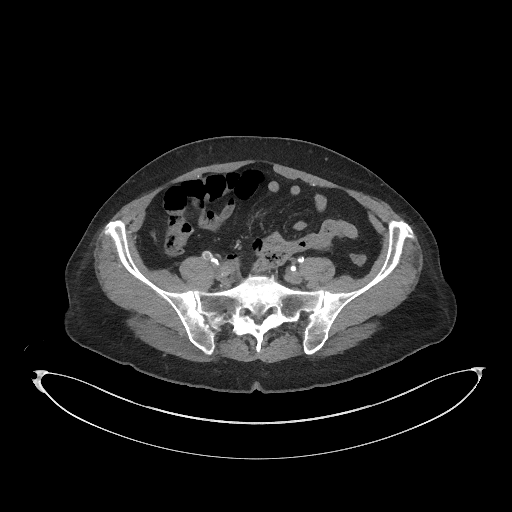

[4 of 16 positions shown; findings below may reference images not displayed]

FINDINGS: Aorta: Visualized lung bases are unremarkable. No gallstones are
noted. The liver, spleen and pancreas are unremarkable. Kidneys and
right adrenal gland appear normal. 2.6 cm low density left adrenal
mass is noted with average Hounsfield measurement of 7, most
consistent with adenoma. The appendix appears normal. There is no
evidence of bowel obstruction. 3.5 cm left ovarian cyst is noted.

Atherosclerosis of abdominal aorta is noted without aneurysm or
dissection. The renal and mesenteric arteries are widely patent
without significant stenosis.

Right Lower Extremity: There is complete occlusion of the right
common and external iliac and common femoral arteries. Occluded
bypass graft is seen extending from right common iliac artery to
common femoral artery. Collateral vessels are seen leading to
reconstitution of right femoral popliteal bypass graft, which is
patent. There is noted moderate diffuse narrowing of the distal
portion of the graft secondary to atheromatous disease. Right
popliteal artery is patent although small in caliber, with all 3
trifurcation vessels patent and flowing toward ankle mortise.

Left Lower Extremity: Severe atheromatous disease is seen involving
the left common and external iliac arteries with multifocal severe
stenoses present. There is complete occlusion of the left common
femoral artery. Collaterals are seen leading to reconstitution of
the left superficial femoral artery, which appears to have stents
throughout most of its course. Severe focal stenosis is noted in its
most distal portion, which is not stented. Left popliteal artery is
patent although diminutive in caliber. All 3 trifurcation vessels
are patent proximally and seen flowing toward the ankle mortise.

Review of the MIP images confirms the above findings.
IMPRESSION: Atherosclerosis of abdominal aorta is noted without aneurysm or
dissection.

Complete occlusion of the right common and external iliac and common
femoral arteries is noted. Popliteal graft extending from right
common iliac artery to right common femoral artery is occluded.
Collateral vessels are leading to reconstitution of right femoral
popliteal bypass graft which is patent. There is noted moderate
diffuse narrowing in the distal portion of the graft. Right
popliteal artery is widely patent with 3 vessel runoff seen in the
right calf.

Severe atheromatous disease is noted involving the left common and
external iliac arteries with multifocal severe stenoses present.
Complete occlusion of left common femoral artery is noted.
Collaterals lead to reconstitution of the left superficial femoral
artery, which appears to have stents throughout its proximal and
middle portions which are widely patent. Severe focal stenosis is
noted in the most distal non stented portion of the left superficial
from artery. Left popliteal artery is patent although diminutive in
caliber, with 3 vessel runoff present in the left calf.

2.6 cm low density left adrenal mass is noted most consistent with
adenoma, but followup CT or MRI in 12 months is recommended to
ensure stability.

3.5 cm left ovarian cyst is noted. If the patient is postmenopausal,
follow-up pelvic ultrasound in 1 year is recommended to ensure
stability and rule out neoplasm.
